# Patient Record
Sex: Male | Born: 1935 | Race: White | Hispanic: No | Marital: Married | Smoking: Former smoker
Health system: Southern US, Community
[De-identification: ages and names within clinical notes are randomized; demographics above are authoritative.]

## PROBLEM LIST (undated history)

## (undated) DIAGNOSIS — M47819 Spondylosis without myelopathy or radiculopathy, site unspecified: Secondary | ICD-10-CM

## (undated) DIAGNOSIS — Z8249 Family history of ischemic heart disease and other diseases of the circulatory system: Secondary | ICD-10-CM

## (undated) DIAGNOSIS — I4891 Unspecified atrial fibrillation: Secondary | ICD-10-CM

## (undated) DIAGNOSIS — G20 Parkinson's disease: Secondary | ICD-10-CM

## (undated) DIAGNOSIS — I2510 Atherosclerotic heart disease of native coronary artery without angina pectoris: Secondary | ICD-10-CM

## (undated) DIAGNOSIS — I10 Essential (primary) hypertension: Secondary | ICD-10-CM

## (undated) DIAGNOSIS — N200 Calculus of kidney: Secondary | ICD-10-CM

## (undated) DIAGNOSIS — E785 Hyperlipidemia, unspecified: Secondary | ICD-10-CM

## (undated) DIAGNOSIS — G20A1 Parkinson's disease without dyskinesia, without mention of fluctuations: Secondary | ICD-10-CM

## (undated) HISTORY — DX: Unspecified atrial fibrillation: I48.91

## (undated) HISTORY — PX: HERNIA REPAIR: SHX51

## (undated) HISTORY — DX: Parkinson's disease: G20

## (undated) HISTORY — DX: Parkinson's disease without dyskinesia, without mention of fluctuations: G20.A1

## (undated) HISTORY — DX: Spondylosis without myelopathy or radiculopathy, site unspecified: M47.819

## (undated) HISTORY — PX: KNEE SURGERY: SHX244

## (undated) HISTORY — DX: Hyperlipidemia, unspecified: E78.5

## (undated) HISTORY — DX: Essential (primary) hypertension: I10

## (undated) HISTORY — DX: Family history of ischemic heart disease and other diseases of the circulatory system: Z82.49

## (undated) HISTORY — DX: Calculus of kidney: N20.0

## (undated) HISTORY — DX: Atherosclerotic heart disease of native coronary artery without angina pectoris: I25.10

---

## 2008-08-20 ENCOUNTER — Emergency Department (HOSPITAL_BASED_OUTPATIENT_CLINIC_OR_DEPARTMENT_OTHER): Admission: EM | Admit: 2008-08-20 | Discharge: 2008-08-20 | Payer: Self-pay | Admitting: Emergency Medicine

## 2008-10-13 DIAGNOSIS — I2510 Atherosclerotic heart disease of native coronary artery without angina pectoris: Secondary | ICD-10-CM

## 2008-10-13 HISTORY — DX: Atherosclerotic heart disease of native coronary artery without angina pectoris: I25.10

## 2008-11-13 HISTORY — PX: CORONARY ARTERY BYPASS GRAFT: SHX141

## 2008-12-02 ENCOUNTER — Encounter: Payer: Self-pay | Admitting: Emergency Medicine

## 2008-12-02 ENCOUNTER — Ambulatory Visit: Payer: Self-pay | Admitting: Diagnostic Radiology

## 2008-12-02 ENCOUNTER — Ambulatory Visit: Payer: Self-pay | Admitting: Cardiology

## 2008-12-02 ENCOUNTER — Ambulatory Visit: Payer: Self-pay | Admitting: Surgery

## 2008-12-02 ENCOUNTER — Inpatient Hospital Stay (HOSPITAL_COMMUNITY): Admission: EM | Admit: 2008-12-02 | Discharge: 2008-12-13 | Payer: Self-pay | Admitting: Cardiology

## 2008-12-04 ENCOUNTER — Encounter (INDEPENDENT_AMBULATORY_CARE_PROVIDER_SITE_OTHER): Payer: Self-pay | Admitting: *Deleted

## 2008-12-05 ENCOUNTER — Encounter (INDEPENDENT_AMBULATORY_CARE_PROVIDER_SITE_OTHER): Payer: Self-pay | Admitting: *Deleted

## 2009-01-04 ENCOUNTER — Encounter: Admission: RE | Admit: 2009-01-04 | Discharge: 2009-01-04 | Payer: Self-pay | Admitting: Surgery

## 2009-01-04 ENCOUNTER — Encounter (HOSPITAL_COMMUNITY): Admission: RE | Admit: 2009-01-04 | Discharge: 2009-04-04 | Payer: Self-pay | Admitting: *Deleted

## 2009-01-04 ENCOUNTER — Ambulatory Visit: Payer: Self-pay | Admitting: Surgery

## 2010-06-25 ENCOUNTER — Ambulatory Visit: Payer: Self-pay | Admitting: Cardiology

## 2010-12-30 ENCOUNTER — Ambulatory Visit (INDEPENDENT_AMBULATORY_CARE_PROVIDER_SITE_OTHER): Payer: Medicare Other | Admitting: Cardiology

## 2010-12-30 DIAGNOSIS — Z951 Presence of aortocoronary bypass graft: Secondary | ICD-10-CM

## 2010-12-30 DIAGNOSIS — E785 Hyperlipidemia, unspecified: Secondary | ICD-10-CM

## 2010-12-30 DIAGNOSIS — I2510 Atherosclerotic heart disease of native coronary artery without angina pectoris: Secondary | ICD-10-CM

## 2010-12-30 DIAGNOSIS — I10 Essential (primary) hypertension: Secondary | ICD-10-CM

## 2011-01-23 LAB — CBC
Hemoglobin: 9.3 g/dL — ABNORMAL LOW (ref 13.0–17.0)
RBC: 2.85 MIL/uL — ABNORMAL LOW (ref 4.22–5.81)
RDW: 13.2 % (ref 11.5–15.5)
WBC: 7.4 10*3/uL (ref 4.0–10.5)

## 2011-01-23 LAB — BASIC METABOLIC PANEL
Calcium: 8.6 mg/dL (ref 8.4–10.5)
GFR calc Af Amer: >60 mL/min (ref >60)
GFR calc non Af Amer: >60 mL/min (ref >60)
Sodium: 138 mEq/L (ref 135–145)

## 2011-01-23 LAB — GLUCOSE, CAPILLARY
Glucose-Capillary: 100 mg/dL — ABNORMAL HIGH (ref 70–99)
Glucose-Capillary: 111 mg/dL — ABNORMAL HIGH (ref 70–99)
Glucose-Capillary: 118 mg/dL — ABNORMAL HIGH (ref 70–99)
Glucose-Capillary: 93 mg/dL (ref 70–99)

## 2011-01-23 LAB — PROTIME-INR
INR: 1.1 (ref 0.00–1.49)
Prothrombin Time: 19.1 seconds — ABNORMAL HIGH (ref 11.6–15.2)

## 2011-01-28 LAB — CBC
HCT: 45.6 % (ref 39.0–52.0)
HCT: 28.3 % — ABNORMAL LOW (ref 39.0–52.0)
HCT: 28.6 % — ABNORMAL LOW (ref 39.0–52.0)
HCT: 30.3 % — ABNORMAL LOW (ref 39.0–52.0)
HCT: 38.5 % — ABNORMAL LOW (ref 39.0–52.0)
HCT: 38.7 % — ABNORMAL LOW (ref 39.0–52.0)
HCT: 39.5 % (ref 39.0–52.0)
HCT: 41 % (ref 39.0–52.0)
Hemoglobin: 15.3 g/dL (ref 13.0–17.0)
Hemoglobin: 10.1 g/dL — ABNORMAL LOW (ref 13.0–17.0)
Hemoglobin: 10.5 g/dL — ABNORMAL LOW (ref 13.0–17.0)
Hemoglobin: 13.8 g/dL (ref 13.0–17.0)
Hemoglobin: 9.9 g/dL — ABNORMAL LOW (ref 13.0–17.0)
MCHC: 34.4 g/dL (ref 30.0–36.0)
MCHC: 34.5 g/dL (ref 30.0–36.0)
MCHC: 35 g/dL (ref 30.0–36.0)
MCHC: 35.1 g/dL (ref 30.0–36.0)
MCV: 93.3 fL (ref 78.0–100.0)
MCV: 94.1 fL (ref 78.0–100.0)
MCV: 94.1 fL (ref 78.0–100.0)
Platelets: 122 10*3/uL — ABNORMAL LOW (ref 150–400)
Platelets: 122 10*3/uL — ABNORMAL LOW (ref 150–400)
Platelets: 133 10*3/uL — ABNORMAL LOW (ref 150–400)
Platelets: 136 10*3/uL — ABNORMAL LOW (ref 150–400)
Platelets: 138 10*3/uL — ABNORMAL LOW (ref 150–400)
RBC: 2.97 MIL/uL — ABNORMAL LOW (ref 4.22–5.81)
RBC: 3.04 MIL/uL — ABNORMAL LOW (ref 4.22–5.81)
RBC: 3.24 MIL/uL — ABNORMAL LOW (ref 4.22–5.81)
RBC: 4.15 MIL/uL — ABNORMAL LOW (ref 4.22–5.81)
RBC: 4.2 MIL/uL — ABNORMAL LOW (ref 4.22–5.81)
RDW: 11.9 % (ref 11.5–15.5)
RDW: 12.2 % (ref 11.5–15.5)
RDW: 12.4 % (ref 11.5–15.5)
RDW: 12.4 % (ref 11.5–15.5)
RDW: 13.2 % (ref 11.5–15.5)
WBC: 5.7 10*3/uL (ref 4.0–10.5)
WBC: 11.1 10*3/uL — ABNORMAL HIGH (ref 4.0–10.5)
WBC: 12 10*3/uL — ABNORMAL HIGH (ref 4.0–10.5)
WBC: 13 10*3/uL — ABNORMAL HIGH (ref 4.0–10.5)
WBC: 6.3 10*3/uL (ref 4.0–10.5)
WBC: 7 10*3/uL (ref 4.0–10.5)

## 2011-01-28 LAB — POCT I-STAT 4, (NA,K, GLUC, HGB,HCT)
Glucose, Bld: 102 mg/dL — ABNORMAL HIGH (ref 70–99)
Glucose, Bld: 104 mg/dL — ABNORMAL HIGH (ref 70–99)
Glucose, Bld: 113 mg/dL — ABNORMAL HIGH (ref 70–99)
Glucose, Bld: 116 mg/dL — ABNORMAL HIGH (ref 70–99)
Glucose, Bld: 128 mg/dL — ABNORMAL HIGH (ref 70–99)
Glucose, Bld: 97 mg/dL (ref 70–99)
HCT: 24 % — ABNORMAL LOW (ref 39.0–52.0)
HCT: 25 % — ABNORMAL LOW (ref 39.0–52.0)
HCT: 25 % — ABNORMAL LOW (ref 39.0–52.0)
HCT: 26 % — ABNORMAL LOW (ref 39.0–52.0)
HCT: 29 % — ABNORMAL LOW (ref 39.0–52.0)
HCT: 38 % — ABNORMAL LOW (ref 39.0–52.0)
Hemoglobin: 12.9 g/dL — ABNORMAL LOW (ref 13.0–17.0)
Hemoglobin: 8.2 g/dL — ABNORMAL LOW (ref 13.0–17.0)
Hemoglobin: 8.5 g/dL — ABNORMAL LOW (ref 13.0–17.0)
Hemoglobin: 8.5 g/dL — ABNORMAL LOW (ref 13.0–17.0)
Hemoglobin: 8.5 g/dL — ABNORMAL LOW (ref 13.0–17.0)
Hemoglobin: 8.8 g/dL — ABNORMAL LOW (ref 13.0–17.0)
Hemoglobin: 8.8 g/dL — ABNORMAL LOW (ref 13.0–17.0)
Hemoglobin: 9.9 g/dL — ABNORMAL LOW (ref 13.0–17.0)
Potassium: 3.6 mEq/L (ref 3.5–5.1)
Potassium: 4 mEq/L (ref 3.5–5.1)
Potassium: 4.6 mEq/L (ref 3.5–5.1)
Sodium: 131 mEq/L — ABNORMAL LOW (ref 135–145)
Sodium: 134 mEq/L — ABNORMAL LOW (ref 135–145)
Sodium: 136 mEq/L (ref 135–145)
Sodium: 139 mEq/L (ref 135–145)

## 2011-01-28 LAB — POCT I-STAT 3, ART BLOOD GAS (G3+)
Acid-Base Excess: 3 mmol/L — ABNORMAL HIGH (ref 0.0–2.0)
Acid-base deficit: 2 mmol/L (ref 0.0–2.0)
Bicarbonate: 22.8 mEq/L (ref 20.0–24.0)
Bicarbonate: 25.4 mEq/L — ABNORMAL HIGH (ref 20.0–24.0)
Bicarbonate: 28.9 mEq/L — ABNORMAL HIGH (ref 20.0–24.0)
Bicarbonate: 29.1 mEq/L — ABNORMAL HIGH (ref 20.0–24.0)
O2 Saturation: 100 %
O2 Saturation: 100 %
O2 Saturation: 98 %
Patient temperature: 37
TCO2: 24 mmol/L (ref 0–100)
TCO2: 31 mmol/L (ref 0–100)
pCO2 arterial: 41.9 mmHg (ref 35.0–45.0)
pCO2 arterial: 43.7 mmHg (ref 35.0–45.0)
pCO2 arterial: 50.4 mmHg — ABNORMAL HIGH (ref 35.0–45.0)
pCO2 arterial: 55.3 mmHg — ABNORMAL HIGH (ref 35.0–45.0)
pH, Arterial: 7.33 — ABNORMAL LOW (ref 7.350–7.450)
pH, Arterial: 7.364 (ref 7.350–7.450)
pH, Arterial: 7.366 (ref 7.350–7.450)
pO2, Arterial: 150 mmHg — ABNORMAL HIGH (ref 80.0–100.0)
pO2, Arterial: 445 mmHg — ABNORMAL HIGH (ref 80.0–100.0)

## 2011-01-28 LAB — TYPE AND SCREEN: Antibody Screen: NEGATIVE

## 2011-01-28 LAB — POCT I-STAT, CHEM 8
BUN: 12 mg/dL (ref 6–23)
Creatinine, Ser: 0.8 mg/dL (ref 0.4–1.5)
HCT: 29 % — ABNORMAL LOW (ref 39.0–52.0)
Hemoglobin: 9.9 g/dL — ABNORMAL LOW (ref 13.0–17.0)
Potassium: 3.6 mEq/L (ref 3.5–5.1)
Sodium: 138 mEq/L (ref 135–145)
Sodium: 138 mEq/L (ref 135–145)
TCO2: 23 mmol/L (ref 0–100)

## 2011-01-28 LAB — URINALYSIS, ROUTINE W REFLEX MICROSCOPIC
Bilirubin Urine: NEGATIVE
Ketones, ur: NEGATIVE mg/dL
Nitrite: NEGATIVE
Protein, ur: NEGATIVE mg/dL
Specific Gravity, Urine: 1.008 (ref 1.005–1.030)
Urobilinogen, UA: 1 mg/dL (ref 0.0–1.0)

## 2011-01-28 LAB — GLUCOSE, CAPILLARY
Glucose-Capillary: 104 mg/dL — ABNORMAL HIGH (ref 70–99)
Glucose-Capillary: 105 mg/dL — ABNORMAL HIGH (ref 70–99)
Glucose-Capillary: 117 mg/dL — ABNORMAL HIGH (ref 70–99)
Glucose-Capillary: 117 mg/dL — ABNORMAL HIGH (ref 70–99)
Glucose-Capillary: 119 mg/dL — ABNORMAL HIGH (ref 70–99)
Glucose-Capillary: 120 mg/dL — ABNORMAL HIGH (ref 70–99)
Glucose-Capillary: 131 mg/dL — ABNORMAL HIGH (ref 70–99)
Glucose-Capillary: 143 mg/dL — ABNORMAL HIGH (ref 70–99)
Glucose-Capillary: 150 mg/dL — ABNORMAL HIGH (ref 70–99)
Glucose-Capillary: 173 mg/dL — ABNORMAL HIGH (ref 70–99)
Glucose-Capillary: 186 mg/dL — ABNORMAL HIGH (ref 70–99)

## 2011-01-28 LAB — LIPID PANEL
Cholesterol: 128 mg/dL (ref 0–200)
LDL Cholesterol: 86 mg/dL (ref 0–99)
Total CHOL/HDL Ratio: 4.6 RATIO
Triglycerides: 72 mg/dL (ref <150)
VLDL: 14 mg/dL (ref 0–40)
VLDL: 23 mg/dL (ref 0–40)

## 2011-01-28 LAB — CREATININE, SERUM
GFR calc Af Amer: >60 mL/min (ref >60)
GFR calc non Af Amer: >60 mL/min (ref >60)

## 2011-01-28 LAB — BASIC METABOLIC PANEL
BUN: 13 mg/dL (ref 6–23)
BUN: 15 mg/dL (ref 6–23)
BUN: 17 mg/dL (ref 6–23)
BUN: 18 mg/dL (ref 6–23)
CO2: 27 mEq/L (ref 19–32)
CO2: 28 mEq/L (ref 19–32)
Calcium: 8 mg/dL — ABNORMAL LOW (ref 8.4–10.5)
Calcium: 8.3 mg/dL — ABNORMAL LOW (ref 8.4–10.5)
Calcium: 9.2 mg/dL (ref 8.4–10.5)
Chloride: 104 mEq/L (ref 96–112)
Chloride: 105 mEq/L (ref 96–112)
Creatinine, Ser: 0.95 mg/dL (ref 0.4–1.5)
Creatinine, Ser: 0.97 mg/dL (ref 0.4–1.5)
GFR calc Af Amer: >60 mL/min (ref >60)
GFR calc Af Amer: >60 mL/min (ref >60)
GFR calc non Af Amer: >60 mL/min (ref >60)
GFR calc non Af Amer: >60 mL/min (ref >60)
GFR calc non Af Amer: >60 mL/min (ref >60)
Glucose, Bld: 101 mg/dL — ABNORMAL HIGH (ref 70–99)
Glucose, Bld: 109 mg/dL — ABNORMAL HIGH (ref 70–99)
Glucose, Bld: 136 mg/dL — ABNORMAL HIGH (ref 70–99)
Glucose, Bld: 98 mg/dL (ref 70–99)
Potassium: 3.5 mEq/L (ref 3.5–5.1)
Potassium: 3.6 mEq/L (ref 3.5–5.1)
Potassium: 3.6 mEq/L (ref 3.5–5.1)
Potassium: 3.8 mEq/L (ref 3.5–5.1)
Sodium: 136 mEq/L (ref 135–145)
Sodium: 138 mEq/L (ref 135–145)
Sodium: 140 mEq/L (ref 135–145)

## 2011-01-28 LAB — BLOOD GAS, ARTERIAL
Acid-Base Excess: 3.1 mmol/L — ABNORMAL HIGH (ref 0.0–2.0)
Bicarbonate: 27.1 mEq/L — ABNORMAL HIGH (ref 20.0–24.0)
Patient temperature: 98
TCO2: 28.4 mmol/L (ref 0–100)
pCO2 arterial: 40.5 mmHg (ref 35.0–45.0)

## 2011-01-28 LAB — DIFFERENTIAL
Basophils Absolute: 0.1 10*3/uL (ref 0.0–0.1)
Basophils Relative: 1 % (ref 0–1)
Neutro Abs: 3.8 10*3/uL (ref 1.7–7.7)
Neutrophils Relative %: 69 % (ref 43–77)

## 2011-01-28 LAB — HEPARIN LEVEL (UNFRACTIONATED): Heparin Unfractionated: 0.18 IU/mL — ABNORMAL LOW (ref 0.30–0.70)

## 2011-01-28 LAB — POCT I-STAT 3, VENOUS BLOOD GAS (G3P V)
Acid-Base Excess: 1 mmol/L (ref 0.0–2.0)
Bicarbonate: 27 mEq/L — ABNORMAL HIGH (ref 20.0–24.0)
O2 Saturation: 93 %
TCO2: 29 mmol/L (ref 0–100)
pCO2, Ven: 49.9 mmHg (ref 45.0–50.0)
pO2, Ven: 72 mmHg — ABNORMAL HIGH (ref 30.0–45.0)

## 2011-01-28 LAB — COMPREHENSIVE METABOLIC PANEL
Alkaline Phosphatase: 89 U/L (ref 39–117)
BUN: 17 mg/dL (ref 6–23)
Chloride: 104 mEq/L (ref 96–112)
GFR calc non Af Amer: >60 mL/min (ref >60)
Glucose, Bld: 137 mg/dL — ABNORMAL HIGH (ref 70–99)
Potassium: 3.7 mEq/L (ref 3.5–5.1)
Total Bilirubin: 0.9 mg/dL (ref 0.3–1.2)

## 2011-01-28 LAB — POCT CARDIAC MARKERS
CKMB, poc: 1.8 ng/mL (ref 1.0–8.0)
Myoglobin, poc: 95.3 ng/mL (ref 12–200)

## 2011-01-28 LAB — CK TOTAL AND CKMB (NOT AT ARMC)
CK, MB: 2.6 ng/mL (ref 0.3–4.0)
CK, MB: 3.9 ng/mL (ref 0.3–4.0)
CK, MB: 4 ng/mL (ref 0.3–4.0)
Total CK: 67 U/L (ref 7–232)
Total CK: 85 U/L (ref 7–232)

## 2011-01-28 LAB — MAGNESIUM: Magnesium: 2.2 mg/dL (ref 1.5–2.5)

## 2011-01-28 LAB — PROTIME-INR: INR: 1.5 (ref 0.00–1.49)

## 2011-01-28 LAB — TROPONIN I: Troponin I: 0.33 ng/mL — ABNORMAL HIGH (ref 0.00–0.06)

## 2011-01-28 LAB — PLATELET COUNT: Platelets: 89 10*3/uL — ABNORMAL LOW (ref 150–400)

## 2011-01-28 LAB — APTT: aPTT: 40 seconds — ABNORMAL HIGH (ref 24–37)

## 2011-02-25 NOTE — Cardiovascular Report (Signed)
NAME:  Harry Morgan, Harry Morgan NO.:  [id number]   MEDICAL RECORD NO.:  [id number]          PATIENT TYPE:  INP   LOCATION:  2923                         FACILITY:  [facility]   PHYSICIAN:  Elmore Guise., M.D.DATE OF BIRTH:  [birth year]   DATE OF PROCEDURE:  12/04/2008  DATE OF DISCHARGE:                            CARDIAC CATHETERIZATION   INDICATIONS FOR PROCEDURE:  Unstable angina.   HISTORY OF PRESENT ILLNESS:  [name] is a very pleasant 77 year old  white male who presented for evaluation of increasing exertional chest  pain.  He is now referred for cardiac catheterization.   DESCRIPTION OF PROCEDURE:  The patient was brought to the cardiac cath  lab.  After appropriate informed consent, he was prepped and draped in a  sterile fashion.  Approximately 5 mL of 1% lidocaine was used for local  anesthesia.  A 5-French sheath was placed in the right femoral artery  without difficulty.  Coronary angiography, LV angiography were then  performed.  The patient was transferred from the cardiac cath lab in  stable condition.   FINDINGS:  1. Left main:  Mild calcification with mild luminal irregularities.  2. LAD:  Mild proximal/mid calcification with mid 50-60% stenosis      after bifurcation with first diagonal.  3. D1:  Ostial 50-60% stenosis.  4. Circumflex:  Nondominant with proximal 60-70% stenosis with plaque      extending to the ostium and proximal part of the first OM vessel.      First OM has an 80-90% ostial/proximal stenosis.  5. OM2:  Large vessel with mild luminal irregularities.  6. RCA:  Dominant with proximal 95% stenosis with sluggish flow down      the mid and distal vessel.  Moderate mid and distal RCA disease was      noted.  7. LV:  EF is 55-60%.  No wall motion abnormalities.  LVEDP is 14      mmHg.   IMPRESSION:  1. Multivessel obstructive coronary artery disease.  2. Preserved left ventricular systolic function, ejection fraction of  55-60%.   PLAN:  At this time, I would recommend continued aggressive medical  therapy.  We will continue low dose heparin because of sluggish flow in  his right coronary artery.  We will obtain a CT surgery consult for  surgical revascularization.      Elmore Guise., M.D.  Electronically Signed     TWK/MEDQ  D:  12/04/2008  T:  12/05/2008  Job:  [id number]

## 2011-02-25 NOTE — Consult Note (Signed)
NAME:  Harry Morgan, DIMOND NO.:  [id number]   MEDICAL RECORD NO.:  [id number]          PATIENT TYPE:  INP   LOCATION:  2923                         FACILITY:  [facility]   PHYSICIAN:  Evelene Croon, M.D.     DATE OF BIRTH:  [birth year]   DATE OF CONSULTATION:  12/05/2008  DATE OF DISCHARGE:                                 CONSULTATION   REFERRING PHYSICIAN:  Rosine Abe, MD   REASON FOR CONSULTATION:  Severe three-vessel coronary disease with  unstable angina.   CLINICAL HISTORY:  I was asked by Dr. Reyes Ivan to evaluate Mr. Winterhalter  for consideration of coronary artery bypass graft surgery.  He is 77-  year-old gentleman with no prior cardiac history who reports recent  history of substernal chest pain developing while walking up and  inclined to his mailbox.  This goes away after a few minutes of rest.  He reported this to be about 7/10 in intensity and different from  previous reflux symptoms that he has had.  His wife said that over the  past couple of weeks he has not been able to do his daily chores around  the house.  He presented to [facility] Room for  electrocardiogram reportedly showed no changes.  His point-of-care  markers were negative x1.  He was transferred to [facility].  His  troponin here initially was 0.33 decreasing to 0.22.  He did have some  brief episodes of chest pain since presentation to [facility].  He  underwent cardiac catheterization yesterday afternoon, which showed  severe three-vessel coronary disease.  The LAD had mild proximal and mid  calcification with 50-60% stenosis.  There is an diagonal branch with 50-  60% ostial stenosis.  The left circumflex was nondominant and proximal  60-70% stenosis.  The first marginal was a small-to-moderate sized  vessel that had 80-90% ostial and proximal stenosis.  The second  marginal was a large vessel.  The right coronary artery was dominant and  had a proximal 95% plus stenosis  and was sluggish flow down the vessel  distally.  Ejection fraction was 55-60% with no mitral regurgitation.  Since catheterization, the patient has improved some episodes of chest  pain with ambulation to the bathroom.   REVIEW OF SYSTEMS:  GENERAL:  He denies any fever or chills.  He has had  no recent weight changes.  He has had some fatigue.  EYES:  Negative.  ENT:  He is hard of hearing and wears bilateral hearing aids.  ENDOCRINE:  He denies diabetes or hypothyroidism.  CARDIOVASCULAR:  As  above.  He denies PND or orthopnea.  He has had no exertional dyspnea.  He has had no peripheral edema or palpitations.  RESPIRATORY:  He denies  cough and sputum production.  GI:  He has had no nausea or vomiting.  He  denies melena and bright red blood per rectum.  GU:  He denies dysuria  or hematuria.  He does have urinary frequency with small volumes,  worsening and present for several years.  NEUROLOGICAL:  He denies any  focal  weakness or numbness.  He does have diagnosis of Parkinson disease  with mild tremor in his right arm.  He has never had TIA or stroke.  SKIN:  Negative.  HEMATOLOGICAL:  Negative.  ALLERGIES:  None.  MUSCULOSKELETAL:  He does have history of arthritis and he has undergone  arthroscopy on his right knee.   ALLERGIES:  None.   PAST MEDICAL HISTORY:  Significant for hypertension times many years.  He is recently diagnosed of Parkinson disease treated medically.  He has  history of degenerative joint disease.  He has undergone bilateral knee  arthroscopy in the past.  He has a history of kidney stones.  He has  history of inguinal hernia repair x2 on the right and 1 on the left.   MEDICATIONS PRIOR TO ADMISSION:  1. Lopressor 100 mg b.i.d.  2. Benicar 20 mg at bedtime.  3. HCTZ 25 mg daily.  4. Carbidopa/levodopa 25/100 t.i.d.   SOCIAL HISTORY:  He is married x50 years and lives with his wife.  He  has one adult child.  He is retired and Artist as a  hobby.  He  quit smoking about 35 years ago after smoking for 20 years.   FAMILY HISTORY:  Negative for coronary artery disease.  His mother died  of aneurysm in the young age.  Father lived into his 12s and died of  unknown reason.   PHYSICAL EXAMINATION:  VITAL SIGNS:  His blood pressure is 123/58, pulse  is 85 and regular, respiratory rate is 16 nonlabored.  GENERAL:  He is a well-developed elderly white male who looks younger  than the stated age.  HEENT:  Normocephalic and atraumatic.  Pupils are equal and reactive to  light and accommodation.  Extraocular muscles are intact.  Throat is  clear.  NECK:  Normal carotid pulses bilaterally.  There are no bruits.  There  is no adenopathy or thyromegaly.  CARDIAC:  Regular rate and rhythm with normal S1 and S2.  There is no  murmur, rub, or gallop.  LUNGS:  Clear.  ABDOMEN:  Active bowel sounds.  Abdomen is soft and nontender.  There  are no palpable masses or organomegaly.  EXTREMITIES:  No peripheral edema.  Pedal pulses are palpable  bilaterally.  SKIN:  Warm and dry.  NEUROLOGIC:  Alert and oriented x3.  Motor and sensory exam are grossly  normal.   IMAGING:  Electrocardiogram shows normal sinus rhythm with inferior ST  abnormality.   LABORATORY DATA:  LFTs were normal on presentation.  BUN was normal and  his creatinine was 1.0.  Electrolytes were normal.  His CPK was 85 with  MB of 3.9, troponin I was 0.23.   IMPRESSION:  Mr. Maul had severe three-vessel coronary artery  disease presenting with unstable angina with positive troponin.  I  agreed that coronary artery bypass graft surgery is the best treatment  to prevent further ischemia and infarction.  I discussed the operative  procedure with the patient and his wife including alternative, benefits,  and risks including, but not limited to bleeding, blood transfusion,  infection, stroke, myocardial infarction, graft failure, and death.  They understand and agreed to  proceed.  We will plan to do this tomorrow  morning.      Evelene Croon, M.D.  Electronically Signed     BB/MEDQ  D:  12/05/2008  T:  12/05/2008  Job:  [id number]   cc:   Elmore Guise., M.D.

## 2011-02-25 NOTE — Discharge Summary (Signed)
NAME:  KENTRAVIOUS, LIPFORD NO.:  [id number]   MEDICAL RECORD NO.:  [id number]          PATIENT TYPE:  INP   LOCATION:  2015                         FACILITY:  [facility]   PHYSICIAN:  Evelene Croon, M.D.     DATE OF BIRTH:  [birth year]   DATE OF ADMISSION:  12/02/2008  DATE OF DISCHARGE:  12/13/2008                               DISCHARGE SUMMARY   ADDENDUM:   DISCHARGE MEDICATIONS:  Coumadin 2.5 mg p.o. daily or as directed.   FOLLOWUP:  A third followup appointment has been added.  The patient  needs to have his PT and INR drawn on Friday, December 16, 2003.  The  patient is to contact [facility] for an appointment time for  this PT and INR to be drawn.  I have discussed with the wife that home  health has been set up and that she may also have the option of having  home health to draw the PT and INR on Friday and fax the results to  [facility], but that she would have to call [facility]  Cardiology's Office first to make sure that this was okay with them.      Doree Fudge, [place]      Evelene Croon, M.D.  Electronically Signed    DZ/MEDQ  D:  12/13/2008  T:  12/14/2008  Job:  [id number]

## 2011-02-25 NOTE — Op Note (Signed)
NAME:  Harry Morgan, Harry Morgan NO.:  [id number]   MEDICAL RECORD NO.:  [id number]          PATIENT TYPE:  INP   LOCATION:  2303                         FACILITY:  [facility]   PHYSICIAN:  Evelene Croon, M.D.     DATE OF BIRTH:  06-12-1928   DATE OF PROCEDURE:  12/06/2008  DATE OF DISCHARGE:                               OPERATIVE REPORT   PREOPERATIVE DIAGNOSIS:  Severe three-vessel coronary disease with  unstable angina.   POSTOPERATIVE DIAGNOSIS:  Severe three-vessel coronary disease with  unstable angina.   OPERATIVE PROCEDURES:  Median sternotomy, extracorporeal circulation,  coronary artery bypass graft surgery x5 using a left internal mammary  artery graft to left anterior descending coronary artery, with a  saphenous vein graft to diagonal branch of the left anterior descending,  a sequential saphenous vein graft to first and second obtuse marginal  branches of the left circumflex coronary artery, and a saphenous vein  graft to the distal right coronary.  Endoscopic vein harvesting from the  right leg.   ATTENDING SURGEON:  Evelene Croon, MD   ASSISTANT:  Kerin Perna, MD   SECOND ASSISTANT:  Stephanie Acre. Dasovich, PAC   ANESTHESIA:  General endotracheal.   CLINICAL HISTORY:  This patient is a 77 year old gentleman with no prior  history of cardiac disease who was admitted with a recent onset of  exertional anginal symptoms occurring while walking up with a slight  incline.  The symptoms progressed to the point where he had some chest  pain at rest.  He presented to the [facility] Emergency Room  where he ruled out for myocardial infarction and was transferred to  [facility] for further care.  His initial troponin here was 0.33 and  decreased to 0.22.  He underwent cardiac catheterization by Dr. Reyes Ivan  which showed severe three-vessel disease with the culprit appearing to  be a 95-99% proximal right coronary stenosis with slow flow distally.  Ejection fraction of 55-60% with no mitral regurgitation.  After review  of the catheterization and examination of the patient, I felt that a  coronary artery bypass graft surgery was the best treatment to prevent  further ischemia and infarction and relief of symptoms.  I discussed the  operative procedure with the patient and his wife including  alternatives, benefits, and risks including but not limited to bleeding,  blood transfusion, infection, stroke, myocardial infarction, graft  failure, and death.  They understood and agreed to proceed.   OPERATIVE PROCEDURE:  The patient was taken to operating room and placed  on the table in supine position.  After induction of general  endotracheal anesthesia, a Foley catheter was placed in bladder using  sterile technique.  Then the chest, abdomen, and both lower extremities  were prepped and draped in the usual sterile manner.  The chest was  entered through a median sternotomy incision and the pericardium opened  midline.  Examination of the heart showed good ventricular  contractility.  The ascending aorta was of normal size and had no  palpable plaques in it.   Then the left internal mammary artery was  harvested from the chest wall  as a pedicle graft.  This was a medium caliber vessel with excellent  blood flow through it.  At the same time, a segment of greater saphenous  vein was harvested from the right leg using endoscopic vein harvest  technique.  This vein was of medium size and fair quality with slightly  thickened walls in some places.   Then the patient was heparinized.  When an adequate activated clotting  time was achieved, the distal ascending aorta was cannulated using a 20-  [place] aortic cannula for arterial inflow.  Venous outflow was achieved  using a 2-stage venous cannula through the right atrial appendage.  An  antegrade cardioplegia and vent cannula was inserted into the aortic  root.   The patient was placed on  cardiopulmonary bypass and distal coronaries  were identified.  The LAD was lying deep in the interventricular groove  and was located distally where it was a large graftable vessel with mild  distal disease in it.  The diagonal branch was also deep in the  epicardial flap and was located and was suitable for grafting.  The left  circumflex gave off a moderate-sized first marginal branch which was  intramyocardial but located without difficulty.  It then gave off a  large second marginal branch that was epicardial and had no distal  disease in it.  The right coronary artery was diffusely diseased.  The  posterior descending and posterolateral branches were both small and  lying deep to large epicardial vein.  Therefore, it was necessary to  graft the distal right coronary artery just before the takeoff of the  posterior descending branch.  There was still moderate disease in the  right coronary artery in this location but no significant stenosis.   Then the aorta was cross-clamped and 600 mL of cold blood antegrade  cardioplegia was administered in the aortic root with quick arrest of  the heart.  Systemic hypothermia to 20 degrees centigrade and topical  hypothermia with iced saline was used.  A temperature probe was placed  in the septum and insulating pad in the pericardium.   The first distal anastomosis was then performed to the first marginal  branch.  The internal diameter of this vessel was about 1.6 mm.  The  conduit used was a segment of greater saphenous vein.  The anastomosis  was performed in a sequential side-to-side manner using continuous 7-0  Prolene suture.  Flow was noted through the graft and was excellent.   Second distal anastomosis was performed to the second marginal branch.  The internal diameter of this vessel was about 2 mm.  The conduit used  was the same segment of greater saphenous vein.  The anastomosis was  performed in a sequential end-to-side manner  using continuous 7-0  Prolene suture.  Flow was noted through the graft and was excellent.   The third distal anastomosis was performed to the distal right coronary  artery.  The internal diameter of this vessel was about 2.5 mm.  The  conduit used was a second segment of greater saphenous vein.  The  anastomosis was performed in a end-to-side manner using continuous 7-0  Prolene suture.  Flow was noted through the graft and was excellent.  Then another dose of cardioplegia was given down the vein grafts and in  the aortic root.   The fourth distal anastomosis was then performed to the diagonal branch.  The internal diameter was 1.5 mm.  The conduit used was a third segment  of greater saphenous vein and the anastomosis was performed in a end-to-  side manner using continuous 7-0 Prolene suture.  Flow was noted through  the graft and was excellent.   The fifth distal anastomosis was performed to the distal LAD.  The  internal diameter was 2 mm.  The conduit used was a left internal  mammary graft and this was brought through an opening in the left  pericardium, anterior to the phrenic nerve.  This was anastomosed to the  LAD in an end-to-side manner using continuous 8-0 Prolene suture.  The  pedicle was sutured to the epicardium with 6-0 Prolene sutures.  The  patient was then rewarmed to 37 degrees centigrade.  Another dose of  cardioplegia was given.  With the cross-clamp in place, the 3 proximal  vein graft anastomoses were performed in the aortic root in an end-to-  side manner using continuous 6-0 Prolene suture.  Then the clamp was  removed from the mammary pedicle.  There was rapid warming of the  ventricular septum and return of spontaneous ventricular fibrillation.  The cross-clamp was removed with time of 86 minutes.  The patient  defibrillated into sinus rhythm.  The proximal and distal anastomoses  appeared hemostatic and while the grafts looked satisfactory.  Graft   markers were placed around the proximal anastomoses.  Two temporary  right ventricular and right atrial pacing wires were placed and brought  out through the skin.   Then the patient was rewarmed to 37 degrees centigrade.  He was weaned  from cardiopulmonary bypass on no inotropic agents.  Total bypass time  was 190 minutes.  Cardiac function appeared excellent with a cardiac  output of 6 L/min.  Protamine was given and the venous and the aortic  cannulas were removed without difficulty.  The patient was given 1 unit  of platelets since his platelet count was 85,000.  After obtaining  hemostasis, 3 chest tubes were placed with two in the posterior  pericardium, one in the anterior mediastinum, and one in the left  pleural space.  Sternum was then closed with double #6 stainless steel  wires.  The fascia was closed with continuous #1 Vicryl suture.  Subcutaneous tissues were closed with continuous 2-0 Vicryl and the skin  with a 3-0 Vicryl subcuticular closure.  Lower extremity vein harvest  site was closed in layers in similar manner.  The sponge, needle, and  instrument counts were correct according to the scrub nurse.  Dry  sterile dressings were applied over the incisions around the chest tubes  which were Pleur-Evac suction.  The patient remained hemodynamically  stable and was transferred to the SICU in guarded but stable condition.      Evelene Croon, M.D.  Electronically Signed     BB/MEDQ  D:  12/06/2008  T:  12/07/2008  Job:  [id number]   cc:   Elmore Guise., M.D.

## 2011-02-25 NOTE — H&P (Signed)
NAME:  Harry Morgan, Harry Morgan NO.:  [id number]   MEDICAL RECORD NO.:  [id number]          PATIENT TYPE:  INP   LOCATION:  3703                         FACILITY:  [facility]   PHYSICIAN:  Rollene Rotunda, MD, FACCDATE OF BIRTH:  May 05, 1928   DATE OF ADMISSION:  12/02/2008  DATE OF DISCHARGE:                              HISTORY & PHYSICAL   The primary care is Dr. Olivia Canter.  Cardiologist: None.   REASON FOR PRESENTATION:  Chest pain.   HISTORY OF PRESENT ILLNESS:  The patient is pleasant 77 year old  gentleman without prior cardiac history.  He has developed chest  discomfort over the last couple of days.  This happens when he is up and  walking.  It goes away after a few minutes of rest.  He points to his  lower sternum.  It can get to 07/10 in intensity.  It is not like  previous reflux or symptoms he has had in the past.  He describes it as  a sharp discomfort.  There is no radiation to his jaw or to his arms.  There is no associated nausea, vomiting or diaphoresis.  His wife said  he looked very uncomfortable and could not do his daily chores today  because of this.  He has had no resting symptoms.  He has had no  palpitations, presyncope or syncope.  He has had no PND or orthopnea.  He went to [facility] ER.  I do not have that EKG but apparently were no  acute changes.  It was treated with Lovenox, nitroglycerin paste, beta  blockers and aspirin.  He has been pain free.  His point of care markers  were negative x1.  He was transferred here.   PAST MEDICAL HISTORY:  Hypertension for many years, Parkinson's disease,  recently diagnosed, degenerative joint disease, nephrolithiasis.   PAST SURGICAL HISTORY:  Inguinal hernia repair x2 on the right and 1 on  the left, bilateral arthroscopic knee surgery.   ALLERGIES AND INTOLERANCES:  None.   MEDICATIONS:  Metoprolol 100 mg b.i.d., Benicar 20 mg q.h.s.,  hydrochlorothiazide 25 mg daily, carbidopa levodopa 25/100  t.i.d.   SOCIAL HISTORY:  The patient is married.  He has 1 child.  He is  retired.  He raises peasants. He quit smoking 35 years ago after smoking  for 20 years.   FAMILY HISTORY:  Noncontributory for early coronary artery disease.  Mother apparently died of an aneurysm at a young age.  His father lived  into his 7's.   REVIEW OF SYSTEMS:  As stated in the HPI positive for bilateral knee  pain and occasional dyspnea with exertion but negative for all other  systems.   PHYSICAL EXAMINATION:  GENERAL:  The patient is pleasant and in no  distress.  VITAL SIGNS:  Blood pressure 160/106, heart rate 85 and regular,  respiratory rate 16, afebrile.  HEENT: Eyelids are unremarkable.  Pupils are equal, round, and reactive  to light. Fundi are not visualized, oral mucosa unremarkable.  NECK: No jugular distention at 45 degrees. Carotid upstroke brisk and  symmetrical.  No bruits, no thyromegaly.  LYMPHATICS:  No cervical,  axillary, or inguinal adenopathy.  LUNGS:  Clear to auscultation bilaterally.  BACK:  No costovertebral angle tenderness.  CHEST:  Unremarkable.  HEART:  PMI not displaced or sustained, S1-S2 within normal limits. No  S3, no S4. No clicks, rubs, no murmurs.  ABDOMEN:  Flat, positive bowel sounds. Normal in frequency and pitch.  No bruits, no rebound, no guarding, no midline pulsatile mass, no  hepatomegaly, no splenomegaly.  SKIN:  No rashes.  No nodules.  EXTREMITIES:  With 2+ pulses throughout, no edema, no cyanosis, no  clubbing.  NEURO:  Oriented to person, place, and time. Cranial nerves II-XII are  grossly intact. Motor grossly intact. Slight right hand tremor.   LABS:  WBC 5.7, hemoglobin 15.3, platelets 166, sodium 143, potassium  2.7, BUN 17, creatinine 1, point care markers negative x2.   Chest x-ray: No acute disease.   EKG sinus rhythm, rate 82, axis within normal limits, intervals within  normal limits, nonspecific inferior ST-T changes.    ASSESSMENT/PLAN:  1. The patient's chest comfort is very suspicious for unstable angina.      I think the pretest probability of obstructive coronary disease is      high.  Therefore, cardiac catheterization will be indicated      electively.  He will be treated with Lovenox nitroglycerin paste,      aspirin and beta blocker.  If he has any urgent symptoms will take      him more urgently to catheterization.  2. Hypertension.  We will use medications as listed in addition to the      nitroglycerin paste.  3. Risk reduction.  Will check a lipid profile and treat      appropriately.  4. Parkinson's disease.  Will continue the carbidopa-levodopa.      Rollene Rotunda, MD, [facility]  Electronically Signed     JH/MEDQ  D:  12/02/2008  T:  12/03/2008  Job:  [id number]   cc:   Olivia Canter, MD

## 2011-02-25 NOTE — Assessment & Plan Note (Signed)
OFFICE VISIT   Harry Morgan, Harry Morgan  DOB:  [birth year]                                        January 04, 2009  CHART #:  [id number]   HISTORY:  The patient returned today for followup status post coronary  artery bypass graft surgery x5 on December 06, 2008.  He has been  recovering slowly due to his Parkinson disease but says that he feels  much better than he did at the time of discharge.  He has been walking  without chest pain or shortness of breath.  He said his energy level is  still not back to normal.  I reassured him this is to be expected.  He  was sent home on Coumadin and amiodarone due to atrial fibrillation and  said that his INR increased to about 5 on Coumadin 2.5 mg per day and  this was held for few days and decreased to 1.25 mg per day.   PHYSICAL EXAMINATION:  VITAL SIGNS:  Today his blood pressure is 143/82,  his pulse is 62 and regular, his respiratory rate is 18 and unlabored.  Oxygen saturation on room air is 96%.  GENERAL:  He looks well.  CARDIAC:  A regular rate and rhythm with normal heart sounds.  LUNG:  Clear.  CHEST:  Incision is healing well and sternum is stable.  EXTREMITIES:  His leg incision is healing well and there is no  peripheral edema.   Followup chest x-ray today shows clear lung fields and no pleural  effusions.   MEDICATIONS:  Carbidopa/levodopa 25/100 t.i.d., Coumadin 1.25 mg daily,  aspirin 81 mg daily, Lopressor 25 mg b.i.d., Crestor 20 mg at bedtime,  amiodarone 200 mg daily, stool softener and Metamucil daily, and  oxycodone p.r.n. for pain.  He is also on Crestor 20 mg at bedtime.   IMPRESSION:  Overall, the patient is making a slow but progressive  recovery following coronary bypass surgery.  He has been slowed by his  Parkinson disease.  I encouraged him to continue walking as much as  possible and he is planning on starting cardiac rehab.  I told him he  can return to driving a car, but should refrain  from lifting anything  heavier than 10 pounds for a  total of 3 months from date of surgery.  He will continue to follow up  with Dr. Reyes Ivan and will contact me if he develops any problems with his  incisions.   Evelene Croon, M.D.  Electronically Signed   BB/MEDQ  D:  01/04/2009  T:  01/05/2009  Job:  [id number]   cc:   Elmore Guise., M.D.

## 2011-02-25 NOTE — Discharge Summary (Signed)
NAME:  Harry Morgan, Harry Morgan NO.:  [id number]   MEDICAL RECORD NO.:  [id number]          PATIENT TYPE:  INP   LOCATION:  2015                         FACILITY:  [facility]   PHYSICIAN:  Evelene Croon, M.D.     DATE OF BIRTH:  Oct 07, 1928   DATE OF ADMISSION:  12/02/2008  DATE OF DISCHARGE:  12/13/2008                               DISCHARGE SUMMARY   HISTORY:  The patient is a 77 year old gentleman without prior cardiac  history.  Prior to admission, the previous couple of days he developed  episodes of chest discomfort.  This would happen with ambulation and be  resolved after a few minutes of rest.  The patient described the pain  primarily in the lower sternum portion of his chest and would get to a  level of 7/10 in intensity.  He denies this to be similar to previous  reflux symptoms which he has had in the past.  He described it primarily  as a sharp discomfort with no radiation to his jaw or arms.  There was  no associated nausea, vomiting, or diaphoresis.  The patient's wife  stated that he looked quite uncomfortable and he was unable to do his  usual activities.  He had no resting symptoms.  He had no palpitations,  presyncope or syncope.  He had no paroxysmal nocturnal dyspnea or  orthopnea.  He presented to the [facility] Room.  Reportedly,  there were no acute changes on his EKG.  He was treated with Lovenox,  nitroglycerin paste, beta-blocker and aspirin and became pain free and  was transferred to [facility] for further evaluation and treatment.  His  point-of-care markers were initially negative x1.   PAST MEDICAL HISTORY:  1. Hypertension.  2. Recently diagnosed Parkinson disease.  3. Degenerative joint disease.  4. History of nephrolithiasis.   PAST SURGICAL HISTORY:  1. Inguinal hernia repair x2 on the right and x1 on the left.  2. Bilateral arthroscopic knee surgery.   ALLERGIES:  None.   MEDICATIONS:  Prior to admission included,  1.  Metoprolol 100 mg b.i.d.  2. Benicar 20 mg at bedtime.  3. Hydrochlorothiazide 25 mg daily.  4. Carbidopa/levodopa 25/100 t.i.d.   Family history, social history, review of symptoms and physical exam,  please see the history and physical done at the time of admission.   HOSPITAL COURSE:  The patient was transferred for findings very  suspicious for unstable angina.  He was felt to require cardiac  catheterization.  This was done on December 05, 2007, by Dr. Reyes Ivan.  Findings were consistent with multivessel obstructive coronary artery  disease with preserved left ventricular function and an ejection  fraction of 55-60%.  For full details, please see the cardiac  catheterization report.  Due to these findings, surgical consultation  was obtained with Evelene Croon, MD who evaluated the patient and his  studies, and agreed with recommendations to proceed with surgical  revascularization as his best option.   PROCEDURE:  On December 06, 2008, he was taken to the operating room and  underwent the following procedure, coronary artery bypass  grafting x5.  The following grafts were placed.  1. Saphenous vein graft to obtuse marginal 1 and obtuse marginal 2.  2. Left internal mammary artery to the LAD.  3. Saphenous vein graft to the diagonal.  4. Saphenous vein graft to the right coronary artery.  The patient      tolerated the procedure well.  He was taken to the Surgical      Intensive Care Unit in stable condition.   POSTOPERATIVE HOSPITAL COURSE:  The patient has overall done well.  His  primary difficulty during the postoperative period has been atrial  fibrillation, but has subsequently been chemically cardioverted to a  normal sinus rhythm, which he is maintaining.  All routine lines,  monitors, and drainage devices have been discontinued in the standard  fashion.  His incisions are healing well.  He is tolerating gradual  increase in activity using standard protocols.  His oxygen  has been  weaned and he maintains good saturations on room air.  He was evaluated  for possible Coumadin use, but it has been determined that at this time,  it is probably more risk than benefit as it relates to chance of falling  in his age as associated to his Parkinson disease.  He does have a  postoperative anemia.  His most recent hemoglobin on December 11, 2008, is  9.3.  Currently, his status is felt to be tentatively stable for  discharge in the morning of December 13, 2008, pending morning round  reevaluation.   MEDICATIONS ON DISCHARGE:  Will be as follows,  1. Pepcid AC 1 at bedtime.  2. Aspirin 325 mg daily.  3. Sinemet 25/100 three times daily, his home dose.  4. Crestor 20 mg daily.  5. Amiodarone 400 mg 3 times daily for 7 days, then twice daily.  6. Lopressor 25 mg twice daily.  7. Lasix 40 mg daily for 5 days.  8. Potassium chloride 20 mEq daily for 5 days.  9. Oxycodone 5 mg 1-2 every 4 hours as needed for pain.   INSTRUCTIONS:  The patient will receive written instructions in regard  to medications, activity, diet, wound care, and followup.   FOLLOWUP:  Dr. Reyes Ivan in 2 weeks and Dr. Laneta Simmers on January 02, 2009, at  11:15.   FINAL DIAGNOSES:  Severe 3-vessel coronary artery disease as described  above.  He ruled out for myocardial infarction.  Most recent HDL 28 and  LDL 113, now status post surgical revascularization as described.   OTHER DIAGNOSES:  1. Hypertension.  2. Parkinson disease.  3. Degenerative joint disease.  4. Nephrolithiasis.  5. Postoperative atrial fibrillation.  6. Postoperative anemia.  7. Postoperative volume overload.  8. History of inguinal hernia repair x2 on the right and x1 on the      left.  9. History of bilateral arthroscopic knee surgery.      Rowe Clack, P.A.-C.      Evelene Croon, M.D.  Electronically Signed    WEG/MEDQ  D:  12/12/2008  T:  12/13/2008  Job:  [id number]   cc:   Evelene Croon, M.D.  Elmore Guise.,  M.D.  Olivia Canter, M.D.

## 2011-07-02 LAB — PREPARE PLATELET PHERESIS

## 2011-07-15 LAB — BASIC METABOLIC PANEL
CO2: 30
Calcium: 10
Creatinine, Ser: 1
GFR calc Af Amer: >60
GFR calc non Af Amer: >60
Sodium: 141

## 2011-07-15 LAB — DIFFERENTIAL
Basophils Absolute: 0
Basophils Relative: 0
Lymphocytes Relative: 29
Monocytes Absolute: 0.5
Monocytes Relative: 7
Neutro Abs: 4.2
Neutrophils Relative %: 62

## 2011-07-15 LAB — POCT CARDIAC MARKERS
CKMB, poc: 1.1
Myoglobin, poc: 89.7
Troponin i, poc: <0.05

## 2011-07-15 LAB — CBC
Hemoglobin: 15.5
MCHC: 34.1
RBC: 4.95
RDW: 12.2

## 2011-07-16 ENCOUNTER — Encounter: Payer: Self-pay | Admitting: Cardiology

## 2011-07-16 ENCOUNTER — Ambulatory Visit (INDEPENDENT_AMBULATORY_CARE_PROVIDER_SITE_OTHER): Payer: Medicare Other | Admitting: Cardiology

## 2011-07-16 ENCOUNTER — Ambulatory Visit: Payer: Medicare Other | Admitting: Cardiology

## 2011-07-16 DIAGNOSIS — I2510 Atherosclerotic heart disease of native coronary artery without angina pectoris: Secondary | ICD-10-CM | POA: Insufficient documentation

## 2011-07-16 DIAGNOSIS — I10 Essential (primary) hypertension: Secondary | ICD-10-CM

## 2011-07-16 DIAGNOSIS — E785 Hyperlipidemia, unspecified: Secondary | ICD-10-CM

## 2011-07-16 NOTE — Patient Instructions (Signed)
Continue your current medications.  Stay as active as you can.  I will see you again in 6 months.

## 2011-07-16 NOTE — Assessment & Plan Note (Signed)
Laboratory data is followed by his primary care physician. We will continue with Crestor.

## 2011-07-16 NOTE — Assessment & Plan Note (Signed)
Blood pressure is in excellent control. It has improved significantly since we started bystolic.

## 2011-07-16 NOTE — Progress Notes (Signed)
Harry Morgan Date of Birth: 04-14-1928   History of Present Illness: Harry Morgan is seen today for followup. He reports that he has been doing well. His activity is limited because of severe arthritis in his knees. He is able to ride a stationary bike for a little bit. He does walk with a cane. He denies any new medical problems on this visit.  Current Outpatient Prescriptions on File Prior to Visit  Medication Sig Dispense Refill  . aspirin 325 MG tablet Take 325 mg by mouth daily.        . carbidopa-levodopa (SINEMET) 25-100 MG per tablet Take 1 tablet by mouth 3 (three) times daily.        Marland Kitchen losartan-hydrochlorothiazide (HYZAAR) 100-25 MG per tablet Take 1 tablet by mouth daily.        . Nebivolol HCl (BYSTOLIC) 20 MG TABS Take 20 mg by mouth daily.        . rosuvastatin (CRESTOR) 20 MG tablet Take 20 mg by mouth daily.          No Known Allergies  Past Medical History  Diagnosis Date  . Coronary artery disease     Status post CABG. This included an LIMA graft to the LAD, saphenous vein graft to the diagonal,  saphenous vein graft to the first and second obtuse marginal vessels, saphenous vein graft to the distal right coronary  . Renal calculi   . Hypertension   . Dyslipidemia   . Atrial fibrillation     Postoperative  . Arthritis of spine   . FHx: coronary artery disease   . Parkinson disease     Past Surgical History  Procedure Date  . Coronary artery bypass graft 11/2008  . Hernia repair     Status post x3  . Knee surgery     Status post arthroscopic    History  Smoking status  . Former Smoker  Smokeless tobacco  . Not on file    History  Alcohol Use No    History reviewed. No pertinent family history.  Review of Systems: The review of systems is positive for severe arthritis in his knees. He has had prior injections and arthroscopic surgery.  All other systems were reviewed and are negative.  Physical Exam: BP 128/78  Pulse 80  Ht 6\' 1"   (1.854 m)  Wt 229 lb 12.8 oz (104.237 kg)  BMI 30.32 kg/m2 He is an elderly white male who is hard of hearing.The patient is alert and oriented x 3.  The mood and affect are normal.  The skin is warm and dry.  Color is normal.  The HEENT exam reveals that the sclera are nonicteric.  The mucous membranes are moist.  The carotids are 2+ without bruits.  There is no thyromegaly.  There is no JVD.  The lungs are clear.  The chest wall is non tender.  The heart exam reveals a regular rate with a normal S1 and S2.  There is a soft grade 1/6 systolic ejection murmur at the left sternal border.  The PMI is not displaced.   Abdominal exam reveals good bowel sounds.  There is no guarding or rebound.  There is no hepatosplenomegaly or tenderness.  There are no masses.  Exam of the legs reveal no clubbing, cyanosis, or edema.  The legs are without rashes.  The distal pulses are intact.  Cranial nerves II - XII are intact.  Motor and sensory functions are intact.  The gait is normal.  LABORATORY DATA:   Assessment / Plan:

## 2011-07-16 NOTE — Assessment & Plan Note (Signed)
He is status post CABG in February 2010. He remains asymptomatic. We will continue with his risk factor modification.

## 2012-01-13 ENCOUNTER — Ambulatory Visit (INDEPENDENT_AMBULATORY_CARE_PROVIDER_SITE_OTHER): Payer: Medicare Other | Admitting: Cardiology

## 2012-01-13 ENCOUNTER — Encounter: Payer: Self-pay | Admitting: Cardiology

## 2012-01-13 VITALS — BP 132/86 | HR 72 | Ht 73.0 in | Wt 226.0 lb

## 2012-01-13 DIAGNOSIS — E785 Hyperlipidemia, unspecified: Secondary | ICD-10-CM

## 2012-01-13 DIAGNOSIS — E7800 Pure hypercholesterolemia, unspecified: Secondary | ICD-10-CM

## 2012-01-13 DIAGNOSIS — I2510 Atherosclerotic heart disease of native coronary artery without angina pectoris: Secondary | ICD-10-CM

## 2012-01-13 DIAGNOSIS — Z951 Presence of aortocoronary bypass graft: Secondary | ICD-10-CM

## 2012-01-13 DIAGNOSIS — I10 Essential (primary) hypertension: Secondary | ICD-10-CM

## 2012-01-13 NOTE — Assessment & Plan Note (Signed)
Blood pressure is in excellent control on losartan HCTZ.

## 2012-01-13 NOTE — Patient Instructions (Signed)
Continue your current medication.  I will see you again in 6 months.   

## 2012-01-13 NOTE — Progress Notes (Signed)
[name] Date of Birth: 09/25/1928   History of Present Illness: [name] is seen today for followup. He reports that he has been doing well from a cardiac standpoint. He denies any chest pain or shortness of breath. He is limited because of severe arthritis in his knees. He states he can't walk or stand very long because of this. He does walk with a cane.  Current Outpatient Prescriptions on File Prior to Visit  Medication Sig Dispense Refill  . aspirin 325 MG tablet Take 325 mg by mouth daily.        . carbidopa-levodopa (SINEMET) 25-100 MG per tablet Take 1 tablet by mouth 3 (three) times daily.        Marland Kitchen losartan-hydrochlorothiazide (HYZAAR) 100-25 MG per tablet Take 1 tablet by mouth daily.        . Nebivolol HCl (BYSTOLIC) 20 MG TABS Take 20 mg by mouth daily.        . Psyllium (METAMUCIL PO) Take by mouth.        . rosuvastatin (CRESTOR) 20 MG tablet Take 20 mg by mouth daily.          No Known Allergies  Past Medical History  Diagnosis Date  . Coronary artery disease     Status post CABG. This included an LIMA graft to the LAD, saphenous vein graft to the diagonal,  saphenous vein graft to the first and second obtuse marginal vessels, saphenous vein graft to the distal right coronary  . Renal calculi   . Hypertension   . Dyslipidemia   . Atrial fibrillation     Postoperative  . Arthritis of spine   . FHx: coronary artery disease   . Parkinson disease     Past Surgical History  Procedure Date  . Coronary artery bypass graft 11/2008  . Hernia repair     Status post x3  . Knee surgery     Status post arthroscopic    History  Smoking status  . Former Smoker  Smokeless tobacco  . Not on file    History  Alcohol Use No    History reviewed. No pertinent family history.  Review of Systems: The review of systems is positive for severe arthritis in his knees. He also has pain in his low back..  All other systems were reviewed and are  negative.  Physical Exam: BP 132/86  Pulse 72  Ht 6\' 1"  (1.854 m)  Wt 226 lb (102.513 kg)  BMI 29.82 kg/m2 He is an elderly white male who is hard of hearing.The patient is alert and oriented x 3.  The mood and affect are normal.  The skin is warm and dry.  Color is normal.  The HEENT exam reveals that the sclera are nonicteric.  The mucous membranes are moist.  The carotids are 2+ without bruits.  There is no thyromegaly.  There is no JVD.  The lungs are clear.  The chest wall is non tender.  The heart exam reveals a regular rate with a normal S1 and S2.  There is a soft grade 1-2/6 systolic ejection murmur at the left sternal border.  The PMI is not displaced.   Abdominal exam reveals good bowel sounds.  There is no guarding or rebound.  There is no hepatosplenomegaly or tenderness.  There are no masses.  Exam of the legs reveal no clubbing, cyanosis, or edema.  The legs are without rashes.  The distal pulses are intact.  Cranial nerves II -  XII are intact.  Motor and sensory functions are intact.  The gait is normal. LABORATORY DATA: ECG today demonstrates normal sinus rhythm with nonspecific ST abnormality.  Assessment / Plan:

## 2012-01-13 NOTE — Assessment & Plan Note (Signed)
He is on chronic Crestor therapy. His labs are followed by his primary care.

## 2012-01-13 NOTE — Assessment & Plan Note (Signed)
He is status post CABG in 2010. He remains asymptomatic. We will continue with aspirin and statin therapy.

## 2012-07-23 ENCOUNTER — Encounter (HOSPITAL_COMMUNITY): Payer: Self-pay | Admitting: *Deleted

## 2012-07-23 ENCOUNTER — Telehealth: Payer: Self-pay | Admitting: Cardiology

## 2012-07-23 ENCOUNTER — Emergency Department (HOSPITAL_COMMUNITY)
Admission: EM | Admit: 2012-07-23 | Discharge: 2012-07-23 | Disposition: A | Discharge status: Home / Self Care | Payer: Medicare Other | Attending: Emergency Medicine | Admitting: Emergency Medicine

## 2012-07-23 ENCOUNTER — Emergency Department (HOSPITAL_COMMUNITY): Payer: Medicare Other

## 2012-07-23 DIAGNOSIS — R609 Edema, unspecified: Secondary | ICD-10-CM | POA: Insufficient documentation

## 2012-07-23 DIAGNOSIS — R0602 Shortness of breath: Secondary | ICD-10-CM

## 2012-07-23 DIAGNOSIS — Z79899 Other long term (current) drug therapy: Secondary | ICD-10-CM | POA: Insufficient documentation

## 2012-07-23 DIAGNOSIS — I10 Essential (primary) hypertension: Secondary | ICD-10-CM | POA: Insufficient documentation

## 2012-07-23 DIAGNOSIS — Z951 Presence of aortocoronary bypass graft: Secondary | ICD-10-CM | POA: Insufficient documentation

## 2012-07-23 DIAGNOSIS — Z7982 Long term (current) use of aspirin: Secondary | ICD-10-CM | POA: Insufficient documentation

## 2012-07-23 DIAGNOSIS — G20 Parkinson's disease: Secondary | ICD-10-CM | POA: Insufficient documentation

## 2012-07-23 DIAGNOSIS — G20A1 Parkinson's disease without dyskinesia, without mention of fluctuations: Secondary | ICD-10-CM | POA: Insufficient documentation

## 2012-07-23 DIAGNOSIS — E785 Hyperlipidemia, unspecified: Secondary | ICD-10-CM | POA: Insufficient documentation

## 2012-07-23 DIAGNOSIS — I509 Heart failure, unspecified: Secondary | ICD-10-CM | POA: Insufficient documentation

## 2012-07-23 DIAGNOSIS — I2510 Atherosclerotic heart disease of native coronary artery without angina pectoris: Secondary | ICD-10-CM | POA: Insufficient documentation

## 2012-07-23 LAB — CBC WITH DIFFERENTIAL/PLATELET
Hemoglobin: 14.9 g/dL (ref 13.0–17.0)
Lymphocytes Relative: 26 % (ref 12–46)
Lymphs Abs: 1.8 10*3/uL (ref 0.7–4.0)
MCH: 32.3 pg (ref 26.0–34.0)
Monocytes Relative: 8 % (ref 3–12)
Neutro Abs: 4.6 10*3/uL (ref 1.7–7.7)
Neutrophils Relative %: 65 % (ref 43–77)
RBC: 4.62 MIL/uL (ref 4.22–5.81)
WBC: 7.1 10*3/uL (ref 4.0–10.5)

## 2012-07-23 LAB — BASIC METABOLIC PANEL
BUN: 18 mg/dL (ref 6–23)
CO2: 29 mEq/L (ref 19–32)
Chloride: 100 mEq/L (ref 96–112)
Glucose, Bld: 108 mg/dL — ABNORMAL HIGH (ref 70–99)
Potassium: 3.7 mEq/L (ref 3.5–5.1)

## 2012-07-23 LAB — POCT I-STAT TROPONIN I
Troponin i, poc: 0 ng/mL (ref 0.00–0.08)
Troponin i, poc: 0.01 ng/mL (ref 0.00–0.08)

## 2012-07-23 NOTE — ED Notes (Signed)
Pt is here for increasing sob for one week.

## 2012-07-23 NOTE — ED Notes (Signed)
Pt states that he "needs more air".  Pt denies any CP with this.  Pt was not able to see his md

## 2012-07-23 NOTE — ED Notes (Signed)
Discharge note: pt dc to home via w/c.  Nad.  Family verbalizes understanding to dc instructions.

## 2012-07-23 NOTE — Telephone Encounter (Signed)
Spoke to patient's wife she stated husband has had sob,episodes of perspiring heavy for 1 week or more.No chest pain. States patient said he feels like he did when he had his heart surgery.Appointment scheduled with Norma Fredrickson NP 07/27/12.Advised take patient to [facility] ER if needed.

## 2012-07-23 NOTE — ED Provider Notes (Signed)
History     CSN: [id number]  Arrival date & time 07/23/12  1509   First MD Initiated Contact with Patient 07/23/12 1918      Chief Complaint  Patient presents with  . Shortness of Breath    (Consider location/radiation/quality/duration/timing/severity/associated sxs/prior treatment) HPI Comments: States that he feels smoothered.  Thinks it is worse with lying down.  Recently at the beach last week and that is where it started.  Also states he was eating a lot of bacon which is not usually in his diet.  Patient is a 78 y.o. male presenting with shortness of breath. The history is provided by the patient.  Shortness of Breath  The current episode started more than 1 week ago. The onset was gradual. The problem occurs frequently. The problem has been gradually worsening. The problem is moderate. The symptoms are relieved by cold air and rest. The symptoms are aggravated by a supine position. Associated symptoms include shortness of breath. Pertinent negatives include no chest pain, no chest pressure, no cough and no wheezing. His past medical history does not include asthma. There were no sick contacts. He has received no recent medical care.    Past Medical History  Diagnosis Date  . Coronary artery disease     Status post CABG. This included an LIMA graft to the LAD, saphenous vein graft to the diagonal,  saphenous vein graft to the first and second obtuse marginal vessels, saphenous vein graft to the distal right coronary  . Renal calculi   . Hypertension   . Dyslipidemia   . Atrial fibrillation     Postoperative  . Arthritis of spine   . FHx: coronary artery disease   . Parkinson disease   . CHF (congestive heart failure)     Past Surgical History  Procedure Date  . Coronary artery bypass graft 11/2008  . Hernia repair     Status post x3  . Knee surgery     Status post arthroscopic    No family history on file.  History  Substance Use Topics  . Smoking status:  Former Games developer  . Smokeless tobacco: Not on file  . Alcohol Use: No      Review of Systems  Respiratory: Positive for shortness of breath. Negative for cough and wheezing.   Cardiovascular: Negative for chest pain and leg swelling.  Gastrointestinal: Negative for nausea, vomiting and abdominal pain.       No dark stools  All other systems reviewed and are negative.    Allergies  Review of patient's allergies indicates no known allergies.  Home Medications   Current Outpatient Rx  Name Route Sig Dispense Refill  . ASPIRIN 325 MG PO TABS Oral Take 325 mg by mouth at bedtime.     Marland Kitchen CARBIDOPA-LEVODOPA 25-100 MG PO TABS Oral Take 1-2 tablets by mouth 3 (three) times daily. Take 1 tablet in the morning, 2 tablets at lunch,  and 1 tablet at night.    Marland Kitchen LOSARTAN POTASSIUM-HCTZ 100-25 MG PO TABS Oral Take 1 tablet by mouth daily.      . NEBIVOLOL HCL 20 MG PO TABS Oral Take 20 mg by mouth daily.      Marland Kitchen METAMUCIL PO Oral Take 5 mLs by mouth at bedtime.    Marland Kitchen ROSUVASTATIN CALCIUM 20 MG PO TABS Oral Take 20 mg by mouth at bedtime.       BP 142/99  Pulse 73  Temp 97.7 F (36.5 C) (Oral)  Resp 19  SpO2 100%  Physical Exam  Nursing note and vitals reviewed. Constitutional: He is oriented to person, place, and time. He appears well-developed and well-nourished. No distress.  HENT:  Head: Normocephalic and atraumatic.  Mouth/Throat: Oropharynx is clear and moist.  Eyes: Conjunctivae normal and EOM are normal. Pupils are equal, round, and reactive to light.  Neck: Normal range of motion. Neck supple.  Cardiovascular: Normal rate, regular rhythm and intact distal pulses.   No murmur heard. Pulmonary/Chest: Effort normal and breath sounds normal. No respiratory distress. He has no wheezes. He has no rales.       Healed sternotomy scar  Abdominal: Soft. He exhibits no distension. There is no tenderness. There is no rebound and no guarding.  Musculoskeletal: Normal range of motion. He  exhibits no tenderness.       Trace edema in lower ext  Neurological: He is alert and oriented to person, place, and time.  Skin: Skin is warm and dry. No rash noted. No erythema.  Psychiatric: He has a normal mood and affect. His behavior is normal.    ED Course  Procedures (including critical care time)  Labs Reviewed  CBC WITH DIFFERENTIAL - Abnormal; Notable for the following:    Platelets 147 (*)     All other components within normal limits  BASIC METABOLIC PANEL - Abnormal; Notable for the following:    Glucose, Bld 108 (*)     GFR calc non Af Amer 54 (*)     GFR calc Af Amer 63 (*)     All other components within normal limits  POCT I-STAT TROPONIN I  PRO B NATRIURETIC PEPTIDE  D-DIMER, QUANTITATIVE  POCT I-STAT TROPONIN I   Dg Chest 2 View  07/23/2012  *RADIOLOGY REPORT*  Clinical Data: Shortness of breath.  CHEST - 2 VIEW  Comparison: 01/04/2009  Findings: Prior CABG. Heart and mediastinal contours are within normal limits.  No focal opacities or effusions.  No acute bony abnormality.  IMPRESSION: No active cardiopulmonary disease.   Original Report Authenticated By: Cyndie Chime, M.D.     Date: 07/23/2012  Rate: 73  Rhythm: normal sinus rhythm and sinus arrhythmia  QRS Axis: normal  Intervals: normal  ST/T Wave abnormalities: nonspecific T wave changes  Conduction Disutrbances:none  Narrative Interpretation:   Old EKG Reviewed: none available    No diagnosis found.    MDM   Patient presenting with worsening shortness of breath over the last week and a half. He denies any chest pain with the shortness of breath and seems to think it is worse when he tries to lie down. He denies any peripheral edema but does have a significant heart history of a CABG 2 years ago. He states that he seems to think his symptoms were like this prior to his CABG. Patient did recently take a trip to the beach this could be a PE versus cardiac pathology such as CHF. Troponin and EKG  are unrevealing today. CBC, BMP, d-dimer, BN P. Pending.  CXR wnl.  10:18 PM All labs wnl.  Second troponin at 6 hours was neg.  10:57 PM Second troponin neg.  Spoke with Dr. Antoine Poche and pt will f/u as outpt.  Denies any sx now and well appearing.  Will d/c home.    Gwyneth Sprout, MD 07/23/12 2257

## 2012-07-23 NOTE — ED Notes (Signed)
Pt states he cont. To be short of breath.  Pt denies any pain at this time.

## 2012-07-23 NOTE — Telephone Encounter (Signed)
plz return call to pt wife Harry Morgan [phone] regarding pt complaints of sweaty, SOB, Smothered feeling, no chest pains

## 2012-07-27 ENCOUNTER — Encounter: Payer: Self-pay | Admitting: Nurse Practitioner

## 2012-07-27 ENCOUNTER — Ambulatory Visit (INDEPENDENT_AMBULATORY_CARE_PROVIDER_SITE_OTHER): Payer: Medicare Other | Admitting: Nurse Practitioner

## 2012-07-27 VITALS — BP 130/80 | HR 60 | Ht 72.0 in | Wt 218.4 lb

## 2012-07-27 DIAGNOSIS — R0989 Other specified symptoms and signs involving the circulatory and respiratory systems: Secondary | ICD-10-CM

## 2012-07-27 DIAGNOSIS — R0600 Dyspnea, unspecified: Secondary | ICD-10-CM

## 2012-07-27 DIAGNOSIS — R0609 Other forms of dyspnea: Secondary | ICD-10-CM

## 2012-07-27 NOTE — Patient Instructions (Addendum)
Continue with your current medicines.  Try to check your blood pressure during one of your spells  We are going to arrange for a stress test (lexiscan)  Try to cut back the salt  Dr. [place] will see you next month as planned.  Call the [facility] office at [phone] if you have any questions, problems or concerns.

## 2012-07-27 NOTE — Progress Notes (Signed)
Harry Morgan Date of Birth: [birth year] Medical Record #[id number]  History of Present Illness: [name] is seen today for a work in visit. He is seen for Dr. [place]. He has known CAD with prior CABG in 2010. Other issues include HTN and HLD. He is limited by his arthritis. His EF was normal at the time of his cath 2 years ago.   He comes in today. He is here with family. His wife called last week to report spells of sweating, dyspnea and a smothery feeling. No chest pain reported. He was in the ER on the 11th of October and had negative cardiac enzymes, negative d dimer and a negative BNP. EKG was ok. CXR was ok. Had been to the beach and had had some extra salt use. He says these spells have just started over the past 1 to 2 weeks. Started at [employer] and he had had more salt. Seemed to be worse after he got home and then told his wife. He gets "hot" and feels like he needs to cool off. Not really dizzy. No palpitations.  Feels smothery. Doesn't really know how long it lasts. Does not wake him up at night. Has had a long standing cough that may be improved. No swelling. His appetite is off and he does not eat as much as he has in the past. Weight is down. His Parkinson's may be a little worse. He is not active. No falls. His blood pressure readings from home show some labile readings which may be more of a reflection of his Parkinson's. He has not checked his blood pressure during any of these episodes of dyspnea.   Current Outpatient Prescriptions on File Prior to Visit  Medication Sig Dispense Refill  . aspirin 325 MG tablet Take 325 mg by mouth at bedtime.       . carbidopa-levodopa (SINEMET) 25-100 MG per tablet Take 1-2 tablets by mouth 3 (three) times daily. Take 1 tablet in the morning, 2 tablets at lunch,  and 1 tablet at night.      . losartan-hydrochlorothiazide (HYZAAR) 100-25 MG per tablet Take 1 tablet by mouth daily.        . Nebivolol HCl (BYSTOLIC) 20 MG TABS Take 20 mg by  mouth daily.        . Psyllium (METAMUCIL PO) Take 5 mLs by mouth at bedtime.      . rosuvastatin (CRESTOR) 20 MG tablet Take 20 mg by mouth at bedtime.         No Known Allergies  Past Medical History  Diagnosis Date  . Coronary artery disease 2010    Status post CABG. This included an LIMA graft to the LAD, saphenous vein graft to the diagonal,  saphenous vein graft to the first and second obtuse marginal vessels, saphenous vein graft to the distal right coronary  . Renal calculi   . Hypertension   . Dyslipidemia   . Atrial fibrillation     Postoperative  . Arthritis of spine   . FHx: coronary artery disease   . Parkinson disease   . CHF (congestive heart failure)     Past Surgical History  Procedure Date  . Coronary artery bypass graft 11/2008  . Hernia repair     Status post x3  . Knee surgery     Status post arthroscopic    History  Smoking status  . Former Smoker  Smokeless tobacco  . Not on file    History  Alcohol  Use No    History reviewed. No pertinent family history.  Review of Systems: The review of systems is per the HPI.  All other systems were reviewed and are negative.  Physical Exam: BP 130/80  Pulse 60  Ht 6' (1.829 m)  Wt 218 lb 6.4 oz (99.066 kg)  BMI 29.62 kg/m2  SpO2 98% Patient is very pleasant and in no acute distress. He has a resting tremor. Skin is warm and dry. Color is normal.  HEENT is unremarkable. Normocephalic/atraumatic. PERRL. Sclera are nonicteric. Neck is supple. No masses. No JVD. Lungs are clear. Cardiac exam shows a regular rate and rhythm. Abdomen is soft. Extremities are without edema. Gait is unsteady. He is using a cane. ROM appears intact. No gross neurologic deficits noted.   LABORATORY DATA:   Chemistry      Component Value Date/Time   NA 139 07/23/2012 2002   K 3.7 07/23/2012 2002   CL 100 07/23/2012 2002   CO2 29 07/23/2012 2002   BUN 18 07/23/2012 2002   CREATININE 1.21 07/23/2012 2002      Component  Value Date/Time   CALCIUM 10.3 07/23/2012 2002   ALKPHOS 89 12/02/2008 1840   AST 23 12/02/2008 1840   ALT 10 12/02/2008 1840   BILITOT 0.9 12/02/2008 1840     Lab Results  Component Value Date   WBC 7.1 07/23/2012   HGB 14.9 07/23/2012   HCT 43.0 07/23/2012   MCV 93.1 07/23/2012   PLT 147* 07/23/2012   Dg Chest 2 View  07/23/2012  *RADIOLOGY REPORT*  Clinical Data: Shortness of breath.  CHEST - 2 VIEW  Comparison: 01/04/2009  Findings: Prior CABG. Heart and mediastinal contours are within normal limits.  No focal opacities or effusions.  No acute bony abnormality.  IMPRESSION: No active cardiopulmonary disease.   Original Report Authenticated By: Cyndie Chime, M.D.    Assessment / Plan:  1. Dyspnea - uncertain etiology.   2. CAD - prior CABG  3. HTN - some labile readings at home noted.   4. Parkinson's - may be a little worse according to the wife.   We are going to arrange for a Lexiscan. That will give [place] an EF as well as to evaluate for possible ischemia etiology. He is to check his blood pressure, especially with one of his spells. For now, no change in his medicines but we will try split dosing his Bystolic since samples are made available today. He is to see Dr. Sw[place]il[place] next month.   Patient and his wife are agreeable to this plan and will call if any problems develop in the interim.

## 2012-07-29 ENCOUNTER — Telehealth: Payer: Self-pay | Admitting: Cardiology

## 2012-07-29 NOTE — Telephone Encounter (Signed)
Pt can't sleep and needs something to sleep they tried calling the pcp but he will not be in until Monday and he can't wait

## 2012-07-30 NOTE — Telephone Encounter (Signed)
Patient called 07/29/12 spoke to wife.She stated patient is having trouble sleeping at night,sob.Stated he has appointment with Dr.Kelly's PA 07/30/12.Advised to keep appointment.Advised to keep appointment for [facility] 08/04/12.

## 2012-07-30 NOTE — Telephone Encounter (Signed)
Spoke to wife she stated patient saw Dr.Kelly today 07/30/12 and was told to take melatonin to help him sleep.Wife stated she thinks patient gets anxious.Advised to have patient keep appointment for [facility] 08/04/12.

## 2012-08-04 ENCOUNTER — Ambulatory Visit (HOSPITAL_COMMUNITY): Payer: Medicare Other | Attending: Cardiovascular Disease | Admitting: Radiology

## 2012-08-04 VITALS — BP 151/93 | HR 76 | Ht 72.0 in | Wt 214.0 lb

## 2012-08-04 DIAGNOSIS — E785 Hyperlipidemia, unspecified: Secondary | ICD-10-CM | POA: Insufficient documentation

## 2012-08-04 DIAGNOSIS — Z8249 Family history of ischemic heart disease and other diseases of the circulatory system: Secondary | ICD-10-CM | POA: Insufficient documentation

## 2012-08-04 DIAGNOSIS — I2510 Atherosclerotic heart disease of native coronary artery without angina pectoris: Secondary | ICD-10-CM

## 2012-08-04 DIAGNOSIS — R0600 Dyspnea, unspecified: Secondary | ICD-10-CM

## 2012-08-04 DIAGNOSIS — I4891 Unspecified atrial fibrillation: Secondary | ICD-10-CM | POA: Insufficient documentation

## 2012-08-04 DIAGNOSIS — R61 Generalized hyperhidrosis: Secondary | ICD-10-CM | POA: Insufficient documentation

## 2012-08-04 DIAGNOSIS — R0602 Shortness of breath: Secondary | ICD-10-CM

## 2012-08-04 DIAGNOSIS — R0989 Other specified symptoms and signs involving the circulatory and respiratory systems: Secondary | ICD-10-CM | POA: Insufficient documentation

## 2012-08-04 DIAGNOSIS — Z87891 Personal history of nicotine dependence: Secondary | ICD-10-CM | POA: Insufficient documentation

## 2012-08-04 DIAGNOSIS — R0609 Other forms of dyspnea: Secondary | ICD-10-CM

## 2012-08-04 DIAGNOSIS — I10 Essential (primary) hypertension: Secondary | ICD-10-CM | POA: Insufficient documentation

## 2012-08-04 DIAGNOSIS — R42 Dizziness and giddiness: Secondary | ICD-10-CM | POA: Insufficient documentation

## 2012-08-04 DIAGNOSIS — Z951 Presence of aortocoronary bypass graft: Secondary | ICD-10-CM | POA: Insufficient documentation

## 2012-08-04 MED ORDER — REGADENOSON 0.4 MG/5ML IV SOLN
0.4000 mg | Freq: Once | INTRAVENOUS | Status: AC
  Administered 2012-08-04: 0.4 mg via INTRAVENOUS

## 2012-08-04 MED ORDER — TECHNETIUM TC 99M SESTAMIBI GENERIC - CARDIOLITE
11.0000 | Freq: Once | INTRAVENOUS | Status: AC | PRN
  Administered 2012-08-04: 11 via INTRAVENOUS

## 2012-08-04 MED ORDER — TECHNETIUM TC 99M SESTAMIBI GENERIC - CARDIOLITE
33.0000 | Freq: Once | INTRAVENOUS | Status: AC | PRN
  Administered 2012-08-04: 33 via INTRAVENOUS

## 2012-08-04 NOTE — Progress Notes (Signed)
[facility] SITE 3 NUCLEAR MED [address] [id number] [place] [address] [phone]  Cardiology Nuclear Med Study  Harry Morgan is a 78 y.o. male     MRN : [id number]     DOB: 01/05/1928  Procedure Date: 08/04/2012  Nuclear Med Background Indication for Stress Test:  Evaluation for Ischemia, Graft Patency and Patient in ED on 07/23/12 for Shortness of Breath with Negative Enzymes  History:  '10 CABG, EF=60%, Post Op Atrial Fibrillation; Cardiac Risk Factors: Family History - CAD, History of Smoking, Hypertension and Lipids  Symptoms:  Diaphoresis, Dizziness, DOE and SOB   Nuclear Pre-Procedure Caffeine/Decaff Intake:  None > 12 hrs NPO After: 9:30pm   Lungs:  Clear. O2 Sat: 97% on room air. IV 0.9% NS with Angio Cath:  22g  IV Site: R Wrist x 1, tolerated well IV Started by:  Irean Hong, RN  Chest Size (in):  40 Cup Size: n/a  Height: 6' (1.829 m)  Weight:  214 lb (97.07 kg)  BMI:  Body mass index is 29.02 kg/(m^2). 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    Nuclear Med Study 1 or 2 day study: 1 day  Stress Test Type:  Lexiscan  Reading MD: Charlton Haws, MD  Order Authorizing Provider:  Atoya Andrew [place], MD  Resting Radionuclide: Technetium 47m Sestamibi  Resting Radionuclide Dose: 11.0 mCi   Stress Radionuclide:  Technetium 59m Sestamibi  Stress Radionuclide Dose: 32.9 mCi           Stress Protocol Rest HR: 76 Stress HR: 101  Rest BP: 151/93 Stress BP: 145/102  Exercise Time (min): n/a METS: n/a   Predicted Max HR: 138 bpm % Max HR: 73.19 bpm Rate Pressure Product: 62952   Dose of Adenosine (mg):  n/a Dose of Lexiscan: 0.4 mg  Dose of Atropine (mg): n/a Dose of Dobutamine: n/a mcg/kg/min (at max HR)  Stress Test Technologist: Smiley Houseman, CMA-N  Nuclear Technologist:  Domenic Polite, CNMT     Rest Procedure:  Myocardial perfusion imaging was performed at rest 45 minutes following the intravenous administration of Technetium 24m  Sestamibi.  Rest ECG: Nonspecific ST-T wave changes.  Stress Procedure:  The patient received IV Lexiscan 0.4 mg over 15-seconds.  Technetium 38m Sestamibi was injected at 30-seconds.  There were no significant changes with Lexiscan.  Quantitative spect images were obtained after a 45 minute delay.  Stress ECG: No significant change from baseline ECG  QPS Raw Data Images:  Normal; no motion artifact; normal heart/lung ratio. Stress Images:  Normal homogeneous uptake in all areas of the myocardium. Rest Images:  Normal homogeneous uptake in all areas of the myocardium. Subtraction (SDS):  Normal Transient Ischemic Dilatation (Normal <1.22):  0.81 Lung/Heart Ratio (Normal <0.45):  0.30  Quantitative Gated Spect Images QGS EDV:  57 ml QGS ESV:  17 ml  Impression Exercise Capacity:  Lexiscan with no exercise. BP Response:  Normal blood pressure response. Clinical Symptoms:  There is dyspnea. ECG Impression:  No significant ST segment change suggestive of ischemia. Comparison with Prior Nuclear Study: No previous nuclear study performed  Overall Impression:  Normal stress nuclear study.  LV Ejection Fraction: 70%.  LV Wall Motion:  NL LV Function; NL Wall Motion   Charlton Haws

## 2012-08-11 ENCOUNTER — Emergency Department (HOSPITAL_BASED_OUTPATIENT_CLINIC_OR_DEPARTMENT_OTHER)
Admission: EM | Admit: 2012-08-11 | Discharge: 2012-08-11 | Disposition: A | Discharge status: Home / Self Care | Payer: Medicare Other | Attending: Emergency Medicine | Admitting: Emergency Medicine

## 2012-08-11 ENCOUNTER — Encounter (HOSPITAL_BASED_OUTPATIENT_CLINIC_OR_DEPARTMENT_OTHER): Payer: Self-pay | Admitting: *Deleted

## 2012-08-11 DIAGNOSIS — Z7982 Long term (current) use of aspirin: Secondary | ICD-10-CM | POA: Insufficient documentation

## 2012-08-11 DIAGNOSIS — I509 Heart failure, unspecified: Secondary | ICD-10-CM | POA: Insufficient documentation

## 2012-08-11 DIAGNOSIS — R259 Unspecified abnormal involuntary movements: Secondary | ICD-10-CM | POA: Insufficient documentation

## 2012-08-11 DIAGNOSIS — G20A1 Parkinson's disease without dyskinesia, without mention of fluctuations: Secondary | ICD-10-CM | POA: Insufficient documentation

## 2012-08-11 DIAGNOSIS — I10 Essential (primary) hypertension: Secondary | ICD-10-CM | POA: Insufficient documentation

## 2012-08-11 DIAGNOSIS — Z79899 Other long term (current) drug therapy: Secondary | ICD-10-CM | POA: Insufficient documentation

## 2012-08-11 DIAGNOSIS — Z87891 Personal history of nicotine dependence: Secondary | ICD-10-CM | POA: Insufficient documentation

## 2012-08-11 DIAGNOSIS — I2510 Atherosclerotic heart disease of native coronary artery without angina pectoris: Secondary | ICD-10-CM | POA: Insufficient documentation

## 2012-08-11 DIAGNOSIS — F411 Generalized anxiety disorder: Secondary | ICD-10-CM | POA: Insufficient documentation

## 2012-08-11 DIAGNOSIS — Z8739 Personal history of other diseases of the musculoskeletal system and connective tissue: Secondary | ICD-10-CM | POA: Insufficient documentation

## 2012-08-11 DIAGNOSIS — G20 Parkinson's disease: Secondary | ICD-10-CM | POA: Insufficient documentation

## 2012-08-11 DIAGNOSIS — Z951 Presence of aortocoronary bypass graft: Secondary | ICD-10-CM | POA: Insufficient documentation

## 2012-08-11 DIAGNOSIS — F419 Anxiety disorder, unspecified: Secondary | ICD-10-CM

## 2012-08-11 DIAGNOSIS — E785 Hyperlipidemia, unspecified: Secondary | ICD-10-CM | POA: Insufficient documentation

## 2012-08-11 MED ORDER — LORAZEPAM 1 MG PO TABS: 0.5000 mg | ORAL_TABLET | Freq: Three times a day (TID) | ORAL | Status: DC | PRN

## 2012-08-11 NOTE — ED Notes (Addendum)
Pt c/o increased tremors x 1 week also c/o constipation, and insomnia. NEg stress x 1 week ago, HX of parkinsons

## 2012-08-11 NOTE — ED Provider Notes (Signed)
History     CSN: [id number]  Arrival date & time 08/11/12  1540   First MD Initiated Contact with Patient 08/11/12 1543      Chief Complaint  Patient presents with  . Tremors  . Constipation    Patient is a 78 y.o. male presenting with constipation. The history is provided by the patient and the spouse.  Constipation  The current episode started more than 1 week ago. The onset was gradual. The problem has been gradually improving. The patient is experiencing no pain. The stool is described as hard. Pertinent negatives include no fever, no abdominal pain, no nausea, no vomiting and no chest pain.  Pt here for two complaints - constipation for past two weeks.  He reports small BM yesterday - but no abd pain and no vomiting.    He also reports increased tremor - he has h/o parkinson for years, but feels "shaky" at night.  No falls.  No focal weakness.  No visual changes.  No HA.  Wife thinks he may be having panic attacks at night.  He denies orthopnea or any other SOB.  No CP is reported.  PCP gave patient Remus Loffler but he refuses to take it.  He has f/u with neuro next week for recheck.  He takes sinemet for parkinson.    Past Medical History  Diagnosis Date  . Coronary artery disease 2010    Status post CABG. This included an LIMA graft to the LAD, saphenous vein graft to the diagonal,  saphenous vein graft to the first and second obtuse marginal vessels, saphenous vein graft to the distal right coronary  . Renal calculi   . Hypertension   . Dyslipidemia   . Atrial fibrillation     Postoperative  . Arthritis of spine   . FHx: coronary artery disease   . Parkinson disease   . CHF (congestive heart failure)     Past Surgical History  Procedure Date  . Coronary artery bypass graft 11/2008  . Hernia repair     Status post x3  . Knee surgery     Status post arthroscopic    History reviewed. No pertinent family history.  History  Substance Use Topics  . Smoking status: Former  Games developer  . Smokeless tobacco: Not on file  . Alcohol Use: No      Review of Systems  Constitutional: Negative for fever.  Respiratory: Negative for shortness of breath.   Cardiovascular: Negative for chest pain.  Gastrointestinal: Positive for constipation. Negative for nausea, vomiting and abdominal pain.  Neurological: Positive for tremors.  All other systems reviewed and are negative.    Allergies  Review of patient's allergies indicates no known allergies.  Home Medications   Current Outpatient Rx  Name Route Sig Dispense Refill  . ASPIRIN 325 MG PO TABS Oral Take 325 mg by mouth at bedtime.     Marland Kitchen CARBIDOPA-LEVODOPA 25-100 MG PO TABS Oral Take 1-2 tablets by mouth 3 (three) times daily. Take 1 tablet in the morning, 2 tablets at lunch,  and 1 tablet at night.    Marland Kitchen CASANTHRANOL-DOCUSATE SODIUM 30-100 MG PO CAPS Oral Take by mouth daily.    Marland Kitchen LORAZEPAM 1 MG PO TABS Oral Take 0.5 tablets (0.5 mg total) by mouth every 8 (eight) hours as needed for anxiety. 8 tablet 0  . LOSARTAN POTASSIUM-HCTZ 100-25 MG PO TABS Oral Take 1 tablet by mouth daily.      . NEBIVOLOL HCL 20 MG PO TABS  Oral Take 20 mg by mouth daily.      Marland Kitchen METAMUCIL PO Oral Take 5 mLs by mouth at bedtime.    Marland Kitchen ROSUVASTATIN CALCIUM 20 MG PO TABS Oral Take 20 mg by mouth at bedtime.       BP 133/83  Pulse 82  Temp 97.6 F (36.4 C) (Oral)  Resp 16  Ht 6' (1.829 m)  Wt 215 lb (97.523 kg)  BMI 29.16 kg/m2  SpO2 100%  Physical Exam CONSTITUTIONAL: Well developed/well nourished HEAD AND FACE: Normocephalic/atraumatic EYES: EOMI/PERRL ENMT: Mucous membranes moist NECK: supple no meningeal signs SPINE:entire spine nontender CV: S1/S2 noted, no murmurs/rubs/gallops noted LUNGS: Lungs are clear to auscultation bilaterally, no apparent distress ABDOMEN: soft, nontender, no rebound or guarding, +BS Rectal - small stool impaction noted.  No blood noted.  No tenderness.  Chaperone present GU:no cva  tenderness NEURO: Pt is awake/alert, moves all extremitiesx4. No arm/leg drift.  Mild Tremor noted in all extremities EXTREMITIES: pulses normal, full ROM SKIN: warm, color normal PSYCH: no abnormalities of mood noted  ED Course  Fecal disimpaction Performed by: Joya Gaskins Authorized by: Joya Gaskins Consent: Verbal consent obtained. Local anesthesia used: no Patient sedated: no Patient tolerance: Patient tolerated the procedure well with no immediate complications. Comments: Partial fecal disimpaction completed with chaperone present no complications     1. Anxiety       MDM  Nursing notes including past medical history and social history reviewed and considered in documentation Previous records reviewed and considered - recent labs noted  For constipation, pt has no signs of bowel obstruction, well appearing  For tremor - pt admits this has been present for awhile but just "feels shaky" at night.  He reports it disrupts sleep.  He refuses ambien.  We discussed possibility of adding low dose of ativan and to take several hrs prior to sleep.  I advised at length precautions with this drug including increased fall risk and sedation.  Wife and patient understand this and will try 0.5mg  of ativan and will see neurologist next week.           Joya Gaskins, MD 08/11/12 [phone]

## 2012-08-27 ENCOUNTER — Encounter: Payer: Self-pay | Admitting: Cardiology

## 2012-08-27 ENCOUNTER — Ambulatory Visit (INDEPENDENT_AMBULATORY_CARE_PROVIDER_SITE_OTHER): Payer: Medicare Other | Admitting: Cardiology

## 2012-08-27 VITALS — BP 130/83 | HR 76 | Ht 72.0 in | Wt 216.0 lb

## 2012-08-27 DIAGNOSIS — I10 Essential (primary) hypertension: Secondary | ICD-10-CM

## 2012-08-27 DIAGNOSIS — I2510 Atherosclerotic heart disease of native coronary artery without angina pectoris: Secondary | ICD-10-CM

## 2012-08-27 DIAGNOSIS — E785 Hyperlipidemia, unspecified: Secondary | ICD-10-CM

## 2012-08-27 NOTE — Progress Notes (Signed)
Harry Morgan Date of Birth: [birth year] Medical Record #[id number]  History of Present Illness: Harry Morgan is seen today for a followup visit. He has known CAD with prior CABG in 2010. Other issues include HTN and HLD. He is limited by his arthritis and Parkinson's. He was seen in October with a multitude of complaints. He been to the emergency room where extensive laboratory evaluation was unremarkable. He complained of shortness of breath. He had difficulty sleeping. Since being started on Xanax by neurology he has done much better. He has no complaints on followup today.  Current Outpatient Prescriptions on File Prior to Visit  Medication Sig Dispense Refill  . aspirin 325 MG tablet Take 325 mg by mouth at bedtime.       . carbidopa-levodopa (SINEMET) 25-100 MG per tablet Take 1-2 tablets by mouth as directed. Take 1 tablet in the morning, 1 tablets at lunch, 1 at 4pm and 1 tablet at night.      . losartan-hydrochlorothiazide (HYZAAR) 100-25 MG per tablet Take 1 tablet by mouth daily.        . Nebivolol HCl (BYSTOLIC) 20 MG TABS Take 20 mg by mouth daily.        . Psyllium (METAMUCIL PO) Take 5 mLs by mouth at bedtime.      . rosuvastatin (CRESTOR) 20 MG tablet Take 20 mg by mouth at bedtime.         No Known Allergies  Past Medical History  Diagnosis Date  . Coronary artery disease 2010    Status post CABG. This included an LIMA graft to the LAD, saphenous vein graft to the diagonal,  saphenous vein graft to the first and second obtuse marginal vessels, saphenous vein graft to the distal right coronary  . Renal calculi   . Hypertension   . Dyslipidemia   . Atrial fibrillation     Postoperative  . Arthritis of spine   . FHx: coronary artery disease   . Parkinson disease   . CHF (congestive heart failure)     Past Surgical History  Procedure Date  . Coronary artery bypass graft 11/2008  . Hernia repair     Status post x3  . Knee surgery     Status post arthroscopic     History  Smoking status  . Former Smoker  Smokeless tobacco  . Not on file    History  Alcohol Use No    History reviewed. No pertinent family history.  Review of Systems: The review of systems is per the HPI.  All other systems were reviewed and are negative.  Physical Exam: BP 130/83  Pulse 76  Ht 6' (1.829 m)  Wt 97.977 kg (216 lb)  BMI 29.29 kg/m2  SpO2 99% Patient is very pleasant and in no acute distress. He has a resting tremor. Skin is warm and dry. Color is normal.  HEENT is unremarkable. Normocephalic/atraumatic. PERRL. Sclera are nonicteric. Neck is supple. No masses. No JVD. Lungs are clear. Cardiac exam shows a regular rate and rhythm. Abdomen is soft. Extremities are without edema. Neurologic exam reveals a tremor. Gait is unsteady. He is using a cane. ROM appears intact. No gross neurologic deficits noted.   LABORATORY DATA: LexiScan Myoview study this past month was normal.  Assessment / Plan:  1. Dyspnea - now resolved. There appears to be a significant anxiety component.  2. CAD - prior CABG, normal Myoview study this past month.  3. HTN - some labile readings at home noted.  4. Parkinson's

## 2012-08-27 NOTE — Patient Instructions (Signed)
Continue your current therapy  I will see you again in six months.

## 2013-03-03 ENCOUNTER — Encounter: Payer: Self-pay | Admitting: Cardiology

## 2013-03-03 ENCOUNTER — Ambulatory Visit (INDEPENDENT_AMBULATORY_CARE_PROVIDER_SITE_OTHER): Payer: Medicare Other | Admitting: Cardiology

## 2013-03-03 VITALS — BP 114/64 | HR 70 | Ht 72.0 in | Wt 215.8 lb

## 2013-03-03 DIAGNOSIS — I2510 Atherosclerotic heart disease of native coronary artery without angina pectoris: Secondary | ICD-10-CM

## 2013-03-03 DIAGNOSIS — I10 Essential (primary) hypertension: Secondary | ICD-10-CM

## 2013-03-03 DIAGNOSIS — E785 Hyperlipidemia, unspecified: Secondary | ICD-10-CM

## 2013-03-03 NOTE — Patient Instructions (Signed)
Continue your current therapy  I will see you in 6 months.   

## 2013-03-03 NOTE — Progress Notes (Signed)
Harry Morgan Date of Birth: Jul 19, 1928 Medical Record #[id number]  History of Present Illness: Harry Morgan is seen today for a followup visit. He has known CAD with prior CABG in February 2010. Other issues include HTN and HLD. He is limited by his arthritis and Parkinson's.  On followup today he is doing very well. He reports that his bystolic dose was reduced 3-4 months ago because of low blood pressure readings. He brings an extensive blood pressure diary today which indicates excellent blood pressure control. He has had no significant chest pain or shortness of breath.  Current Outpatient Prescriptions on File Prior to Visit  Medication Sig Dispense Refill  . ALPRAZolam (XANAX) 0.5 MG tablet Take 0.5 mg by mouth as directed.       Marland Kitchen aspirin 325 MG tablet Take 325 mg by mouth at bedtime.       . carbidopa-levodopa (SINEMET) 25-100 MG per tablet Take 1-2 tablets by mouth as directed. Take 1 tablet in the morning, 1 tablets at lunch, 1 at 4pm and 1 tablet at night.      . losartan-hydrochlorothiazide (HYZAAR) 100-25 MG per tablet Take 1 tablet by mouth daily.        . Nebivolol HCl (BYSTOLIC) 20 MG TABS Take 10 mg by mouth daily.       . polyethylene glycol powder (GLYCOLAX/MIRALAX) powder       . Psyllium (METAMUCIL PO) Take 5 mLs by mouth at bedtime.      . rosuvastatin (CRESTOR) 20 MG tablet Take 20 mg by mouth at bedtime.        No current facility-administered medications on file prior to visit.    No Known Allergies  Past Medical History  Diagnosis Date  . Coronary artery disease 2010    Status post CABG. This included an LIMA graft to the LAD, saphenous vein graft to the diagonal,  saphenous vein graft to the first and second obtuse marginal vessels, saphenous vein graft to the distal right coronary  . Renal calculi   . Hypertension   . Dyslipidemia   . Atrial fibrillation     Postoperative  . Arthritis of spine   . FHx: coronary artery disease   . Parkinson disease    . CHF (congestive heart failure)     Past Surgical History  Procedure Laterality Date  . Coronary artery bypass graft  11/2008  . Hernia repair      Status post x3  . Knee surgery      Status post arthroscopic    History  Smoking status  . Former Smoker  Smokeless tobacco  . Not on file    History  Alcohol Use No    History reviewed. No pertinent family history.  Review of Systems: The review of systems is per the HPI.  All other systems were reviewed and are negative.  Physical Exam: BP 114/64  Pulse 70  Ht 6' (1.829 m)  Wt 215 lb 12.8 oz (97.886 kg)  BMI 29.26 kg/m2  SpO2 95% Patient is very pleasant and in no acute distress. He has a resting tremor. Skin is warm and dry. Color is normal.  HEENT is unremarkable. Normocephalic/atraumatic. PERRL. Sclera are nonicteric. Neck is supple. No masses. No JVD. Lungs are clear. Cardiac exam shows a regular rate and rhythm. Abdomen is soft. Extremities are without edema. Neurologic exam reveals a tremor. Gait is unsteady. He is using a cane. ROM appears intact. No gross neurologic deficits noted.   LABORATORY DATA:  Assessment / Plan:  1.  CAD - prior CABG, normal Myoview study October 2013. Continue current medical therapy.  2. HTN - blood pressure under excellent control on lower dose of bystolic  3. Parkinson's disease  4. Hyperlipidemia-on Crestor.

## 2013-08-31 ENCOUNTER — Encounter: Payer: Self-pay | Admitting: Cardiology

## 2013-08-31 ENCOUNTER — Ambulatory Visit (INDEPENDENT_AMBULATORY_CARE_PROVIDER_SITE_OTHER): Payer: Medicare Other | Admitting: Cardiology

## 2013-08-31 VITALS — BP 150/90 | HR 68 | Ht 72.0 in | Wt 214.0 lb

## 2013-08-31 DIAGNOSIS — I10 Essential (primary) hypertension: Secondary | ICD-10-CM

## 2013-08-31 DIAGNOSIS — I2510 Atherosclerotic heart disease of native coronary artery without angina pectoris: Secondary | ICD-10-CM

## 2013-08-31 DIAGNOSIS — E785 Hyperlipidemia, unspecified: Secondary | ICD-10-CM

## 2013-08-31 NOTE — Progress Notes (Signed)
Harry Morgan Date of Birth: 1928/02/15 Medical Record #[id number]  History of Present Illness: Harry Morgan is seen today for a followup visit. He has known CAD with prior CABG in February 2010. Other issues include HTN and HLD. He is limited by his arthritis and Parkinson's. His last Myoview study in October 2013 was normal. On followup today he is doing very well. According to his home blood pressure monitor his blood pressure readings have been good and he even will take only half a dose of losartan HCT if his blood pressures running low. He denies any chest pain or shortness of breath.  Current Outpatient Prescriptions on File Prior to Visit  Medication Sig Dispense Refill  . ALPRAZolam (XANAX) 0.5 MG tablet Take 0.5 mg by mouth as directed.       Marland Kitchen aspirin 325 MG tablet Take 325 mg by mouth at bedtime.       . carbidopa-levodopa (SINEMET) 25-100 MG per tablet Take 1-2 tablets by mouth as directed. Take 1 tablet in the morning, 1 tablets at lunch, 1 at 4pm and 1 tablet at night.      . losartan-hydrochlorothiazide (HYZAAR) 100-25 MG per tablet Take 1 tablet by mouth daily.        . polyethylene glycol powder (GLYCOLAX/MIRALAX) powder       . Psyllium (METAMUCIL PO) Take 5 mLs by mouth at bedtime.      . rosuvastatin (CRESTOR) 20 MG tablet Take 20 mg by mouth at bedtime.        No current facility-administered medications on file prior to visit.    No Known Allergies  Past Medical History  Diagnosis Date  . Coronary artery disease 2010    Status post CABG. This included an LIMA graft to the LAD, saphenous vein graft to the diagonal,  saphenous vein graft to the first and second obtuse marginal vessels, saphenous vein graft to the distal right coronary  . Renal calculi   . Hypertension   . Dyslipidemia   . Atrial fibrillation     Postoperative  . Arthritis of spine   . FHx: coronary artery disease   . Parkinson disease   . CHF (congestive heart failure)     Past  Surgical History  Procedure Laterality Date  . Coronary artery bypass graft  11/2008  . Hernia repair      Status post x3  . Knee surgery      Status post arthroscopic    History  Smoking status  . Former Smoker  Smokeless tobacco  . Not on file    History  Alcohol Use No    History reviewed. No pertinent family history.  Review of Systems: The review of systems is per the HPI.  All other systems were reviewed and are negative.  Physical Exam: BP 150/90  Pulse 68  Ht 6' (1.829 m)  Wt 214 lb (97.07 kg)  BMI 29.02 kg/m2 Patient is very pleasant and in no acute distress. He has a resting tremor. Skin is warm and dry. Color is normal.  HEENT is unremarkable. Normocephalic/atraumatic. PERRL. Sclera are nonicteric. Neck is supple. No masses. No JVD. Lungs are clear. Cardiac exam shows a regular rate and rhythm. Abdomen is soft. Extremities are without edema. Neurologic exam reveals a tremor. Gait is unsteady. He is using a cane. ROM appears intact. No gross neurologic deficits noted.   LABORATORY DATA: ECG today demonstrates normal sinus rhythm with a rate of 68 beats per minute. It is normal.  Assessment / Plan:  1.  CAD - prior CABG, normal Myoview study October 2013. Continue current medical therapy.  2. HTN - blood pressure under fair control.  3. Parkinson's disease  4. Hyperlipidemia-on Crestor.

## 2013-08-31 NOTE — Patient Instructions (Signed)
Continue your current therapy  I will see you in 6 months.   

## 2013-08-31 NOTE — Addendum Note (Signed)
Addended by: Johnston Ebbs on: 08/31/2013 02:10 PM   Modules accepted: Orders

## 2014-02-27 ENCOUNTER — Encounter: Payer: Self-pay | Admitting: Cardiology

## 2014-02-27 ENCOUNTER — Ambulatory Visit (INDEPENDENT_AMBULATORY_CARE_PROVIDER_SITE_OTHER): Payer: Medicare Other | Admitting: Cardiology

## 2014-02-27 VITALS — BP 149/99 | HR 79 | Ht 72.0 in | Wt 210.8 lb

## 2014-02-27 DIAGNOSIS — I2510 Atherosclerotic heart disease of native coronary artery without angina pectoris: Secondary | ICD-10-CM

## 2014-02-27 DIAGNOSIS — I10 Essential (primary) hypertension: Secondary | ICD-10-CM

## 2014-02-27 DIAGNOSIS — E785 Hyperlipidemia, unspecified: Secondary | ICD-10-CM

## 2014-02-27 MED ORDER — LOSARTAN POTASSIUM-HCTZ 50-12.5 MG PO TABS: 1.0000 | ORAL_TABLET | Freq: Every day | ORAL | Status: DC

## 2014-02-27 NOTE — Patient Instructions (Signed)
Continue your current therapy  I will see you in 6 months.   

## 2014-02-27 NOTE — Progress Notes (Signed)
Mingo Amberhomas L Wollman Date of Birth: 07-15-1928 Medical Record #[id number]  History of Present Illness: [name] is seen today for a followup visit. He has known CAD with prior CABG in February 2010. Other issues include HTN and HLD. He is limited by his arthritis and Parkinson's. His last Myoview study in October 2013 was normal. On followup today he is doing very well. According to his home blood pressure monitor his blood pressure readings have been very good. Ranges from 96-129 systolic. He is limited in his walking by arthritis in his knees. No chest pain or SOB.  Current Outpatient Prescriptions on File Prior to Visit  Medication Sig Dispense Refill  . ALPRAZolam (XANAX) 0.5 MG tablet Take 0.5 mg by mouth as directed.       Marland Kitchen. aspirin 325 MG tablet Take 325 mg by mouth at bedtime.       . carbidopa-levodopa (SINEMET) 25-100 MG per tablet Take 1-2 tablets by mouth as directed. Take 1 tablet in the morning, 1 tablets at lunch, 1 at 4pm and 1 tablet at night.      . nebivolol (BYSTOLIC) 10 MG tablet Take 10 mg by mouth daily.      . polyethylene glycol powder (GLYCOLAX/MIRALAX) powder       . Psyllium (METAMUCIL PO) Take 5 mLs by mouth at bedtime.      . rosuvastatin (CRESTOR) 20 MG tablet Take 20 mg by mouth at bedtime.        No current facility-administered medications on file prior to visit.    No Known Allergies  Past Medical History  Diagnosis Date  . Coronary artery disease 2010    Status post CABG. This included an LIMA graft to the LAD, saphenous vein graft to the diagonal,  saphenous vein graft to the first and second obtuse marginal vessels, saphenous vein graft to the distal right coronary  . Renal calculi   . Hypertension   . Dyslipidemia   . Atrial fibrillation     Postoperative  . Arthritis of spine   . FHx: coronary artery disease   . Parkinson disease   . CHF (congestive heart failure)     Past Surgical History  Procedure Laterality Date  . Coronary artery  bypass graft  11/2008  . Hernia repair      Status post x3  . Knee surgery      Status post arthroscopic    History  Smoking status  . Former Smoker  Smokeless tobacco  . Not on file    History  Alcohol Use No    History reviewed. No pertinent family history.  Review of Systems: The review of systems is per the HPI.  All other systems were reviewed and are negative.  Physical Exam: BP 149/99  Pulse 79  Ht 6' (1.829 m)  Wt 210 lb 12.8 oz (95.618 kg)  BMI 28.58 kg/m2 Patient is very pleasant and in no acute distress. He has a resting tremor R>L. Skin is warm and dry. Color is normal.  HEENT is unremarkable. Normocephalic/atraumatic. PERRL. Sclera are nonicteric. Neck is supple. No masses. No JVD. Lungs are clear. Cardiac exam shows a regular rate and rhythm. Abdomen is soft. Extremities are without edema. Neurologic exam reveals a tremor. Gait is unsteady. He is using a cane. ROM appears intact. No gross neurologic deficits noted.   LABORATORY DATA:   Assessment / Plan:  1.  CAD - prior CABG, normal Myoview study October 2013. Continue current medical therapy.  2.  HTN - blood pressure under good control based on home readings.  3. Parkinson's disease  4. Hyperlipidemia-on Crestor.  I will follow up in 6 months.

## 2014-08-30 ENCOUNTER — Encounter: Payer: Self-pay | Admitting: Cardiology

## 2014-08-30 ENCOUNTER — Ambulatory Visit (INDEPENDENT_AMBULATORY_CARE_PROVIDER_SITE_OTHER): Payer: Medicare Other | Admitting: Cardiology

## 2014-08-30 VITALS — BP 126/78 | HR 81 | Ht 72.0 in | Wt 210.2 lb

## 2014-08-30 DIAGNOSIS — I25729 Atherosclerosis of autologous artery coronary artery bypass graft(s) with unspecified angina pectoris: Secondary | ICD-10-CM

## 2014-08-30 DIAGNOSIS — I2510 Atherosclerotic heart disease of native coronary artery without angina pectoris: Secondary | ICD-10-CM

## 2014-08-30 DIAGNOSIS — E785 Hyperlipidemia, unspecified: Secondary | ICD-10-CM

## 2014-08-30 DIAGNOSIS — I10 Essential (primary) hypertension: Secondary | ICD-10-CM

## 2014-08-30 MED ORDER — NEBIVOLOL HCL 5 MG PO TABS: 5.0000 mg | ORAL_TABLET | Freq: Every day | ORAL | Status: DC

## 2014-08-30 NOTE — Patient Instructions (Addendum)
Medication samples have been provided to the patient. Drug name: Crestor 20 mg Qty: 35 tab LOT: [id number]  Exp.Date: 01/2017  Neta Ehlersruitt, Chelsi Warr M 9:09 AM 08/30/2014   We will reduce Bystolic to 5 mg daily  Continue your other therapy  I will see you in 6 months.

## 2014-08-30 NOTE — Progress Notes (Signed)
Harry Morgan Date of Birth: December 08, 1927 Medical Record #[id number]  History of Present Illness: Harry Morgan is seen today for follow up of CAD. He has known CAD with prior CABG in February 2010. Other issues include HTN and HLD. He is limited by his arthritis and Parkinson's. His last Myoview study in October 2013 was normal. On followup today he is doing very well. He is limited in his walking by arthritis in his knees. No chest pain or SOB. He wife states he doesn't like to go out a lot because of his tremors.   Current Outpatient Prescriptions on File Prior to Visit  Medication Sig Dispense Refill  . ALPRAZolam (XANAX) 0.5 MG tablet Take 0.5 mg by mouth as directed.     Marland Kitchen. aspirin 325 MG tablet Take 325 mg by mouth at bedtime.     . carbidopa-levodopa (SINEMET) 25-100 MG per tablet Take 1-2 tablets by mouth as directed. Take 1 tablet in the morning, 1 tablets at lunch, 1 at 4pm and 1 tablet at night.    . Cholecalciferol (VITAMIN D PO) Take by mouth every morning.    Marland Kitchen. losartan-hydrochlorothiazide (HYZAAR) 50-12.5 MG per tablet Take 1 tablet by mouth daily.    . polyethylene glycol powder (GLYCOLAX/MIRALAX) powder     . Psyllium (METAMUCIL PO) Take 5 mLs by mouth at bedtime.    . rosuvastatin (CRESTOR) 20 MG tablet Take 20 mg by mouth at bedtime.      No current facility-administered medications on file prior to visit.    No Known Allergies  Past Medical History  Diagnosis Date  . Coronary artery disease 2010    Status post CABG. This included an LIMA graft to the LAD, saphenous vein graft to the diagonal,  saphenous vein graft to the first and second obtuse marginal vessels, saphenous vein graft to the distal right coronary  . Renal calculi   . Hypertension   . Dyslipidemia   . Atrial fibrillation     Postoperative  . Arthritis of spine   . FHx: coronary artery disease   . Parkinson disease   . CHF (congestive heart failure)     Past Surgical History  Procedure  Laterality Date  . Coronary artery bypass graft  11/2008  . Hernia repair      Status post x3  . Knee surgery      Status post arthroscopic    History  Smoking status  . Former Smoker  Smokeless tobacco  . Not on file    History  Alcohol Use No    History reviewed. No pertinent family history.  Review of Systems: The review of systems is per the HPI.  He does have chronic frequent urination. He is followed by urology. All other systems were reviewed and are negative.  Physical Exam: BP 126/78 mmHg  Pulse 81  Ht 6' (1.829 m)  Wt 210 lb 3.2 oz (95.346 kg)  BMI 28.50 kg/m2 Patient is very pleasant and in no acute distress. He has a resting tremor R>L. Skin is warm and dry. Color is normal.  HEENT is unremarkable. Normocephalic/atraumatic. PERRL. Sclera are nonicteric. Neck is supple. No masses. No JVD. Lungs are clear. Cardiac exam shows a regular rate and rhythm. Normal S1-2. No gallop or murmur. Abdomen is soft. Extremities are without edema. Neurologic exam reveals a tremor. Gait is unsteady. He is using a cane. ROM appears intact. No gross neurologic deficits noted.   LABORATORY DATA: Ecg today: NSR, LAD, LVH by voltage.  I have personally reviewed and interpreted this study.   Assessment / Plan:  1.  CAD - prior CABG, normal Myoview study October 2013. Continue current medical therapy.  2. HTN - blood pressure under good control.  3. Parkinson's disease  4. Hyperlipidemia-on Crestor.  I will follow up in 6 months.

## 2014-11-18 ENCOUNTER — Encounter (HOSPITAL_COMMUNITY): Payer: Self-pay | Admitting: *Deleted

## 2014-11-18 ENCOUNTER — Emergency Department (HOSPITAL_COMMUNITY): Payer: Medicare Other

## 2014-11-18 ENCOUNTER — Observation Stay (HOSPITAL_COMMUNITY)
Admission: EM | Admit: 2014-11-18 | Discharge: 2014-11-20 | Disposition: A | Discharge status: Home / Self Care | Payer: Medicare Other | Attending: Internal Medicine | Admitting: Internal Medicine

## 2014-11-18 DIAGNOSIS — M479 Spondylosis, unspecified: Secondary | ICD-10-CM | POA: Diagnosis not present

## 2014-11-18 DIAGNOSIS — I10 Essential (primary) hypertension: Secondary | ICD-10-CM | POA: Diagnosis not present

## 2014-11-18 DIAGNOSIS — E785 Hyperlipidemia, unspecified: Secondary | ICD-10-CM

## 2014-11-18 DIAGNOSIS — Z7982 Long term (current) use of aspirin: Secondary | ICD-10-CM | POA: Insufficient documentation

## 2014-11-18 DIAGNOSIS — I25729 Atherosclerosis of autologous artery coronary artery bypass graft(s) with unspecified angina pectoris: Secondary | ICD-10-CM

## 2014-11-18 DIAGNOSIS — I2510 Atherosclerotic heart disease of native coronary artery without angina pectoris: Secondary | ICD-10-CM | POA: Diagnosis present

## 2014-11-18 DIAGNOSIS — Z87442 Personal history of urinary calculi: Secondary | ICD-10-CM | POA: Insufficient documentation

## 2014-11-18 DIAGNOSIS — R55 Syncope and collapse: Principal | ICD-10-CM

## 2014-11-18 DIAGNOSIS — G20 Parkinson's disease: Secondary | ICD-10-CM

## 2014-11-18 DIAGNOSIS — I509 Heart failure, unspecified: Secondary | ICD-10-CM | POA: Diagnosis not present

## 2014-11-18 DIAGNOSIS — R42 Dizziness and giddiness: Secondary | ICD-10-CM | POA: Insufficient documentation

## 2014-11-18 DIAGNOSIS — Z79899 Other long term (current) drug therapy: Secondary | ICD-10-CM | POA: Insufficient documentation

## 2014-11-18 LAB — CBC
HEMATOCRIT: 45.3 % (ref 39.0–52.0)
HEMOGLOBIN: 15.2 g/dL (ref 13.0–17.0)
MCH: 31.7 pg (ref 26.0–34.0)
MCHC: 33.6 g/dL (ref 30.0–36.0)
MCV: 94.6 fL (ref 78.0–100.0)
PLATELETS: 155 10*3/uL (ref 150–400)
RBC: 4.79 MIL/uL (ref 4.22–5.81)
RDW: 12.5 % (ref 11.5–15.5)
WBC: 5.6 10*3/uL (ref 4.0–10.5)

## 2014-11-18 LAB — BASIC METABOLIC PANEL
Anion gap: 11 (ref 5–15)
BUN: 17 mg/dL (ref 6–23)
CHLORIDE: 103 mmol/L (ref 96–112)
CO2: 27 mmol/L (ref 19–32)
Calcium: 9.4 mg/dL (ref 8.4–10.5)
Creatinine, Ser: 1.02 mg/dL (ref 0.50–1.35)
GFR calc non Af Amer: 65 mL/min — ABNORMAL LOW (ref >90)
GFR, EST AFRICAN AMERICAN: 76 mL/min — AB (ref >90)
GLUCOSE: 118 mg/dL — AB (ref 70–99)
Potassium: 3.4 mmol/L — ABNORMAL LOW (ref 3.5–5.1)
SODIUM: 141 mmol/L (ref 135–145)

## 2014-11-18 LAB — I-STAT TROPONIN, ED: TROPONIN I, POC: 0 ng/mL (ref 0.00–0.08)

## 2014-11-18 MED ORDER — ASPIRIN 325 MG PO TABS
325.0000 mg | ORAL_TABLET | Freq: Every day | ORAL | Status: DC
  Administered 2014-11-19 (×2): 325 mg via ORAL
  Filled 2014-11-18 (×3): qty 1

## 2014-11-18 MED ORDER — CARBIDOPA-LEVODOPA 25-100 MG PO TABS
1.0000 | ORAL_TABLET | Freq: Once | ORAL | Status: AC
  Administered 2014-11-18: 1 via ORAL
  Filled 2014-11-18: qty 1

## 2014-11-18 MED ORDER — VITAMIN D3 25 MCG (1000 UNIT) PO TABS
1000.0000 [IU] | ORAL_TABLET | Freq: Every day | ORAL | Status: DC
  Administered 2014-11-19 – 2014-11-20 (×2): 1000 [IU] via ORAL
  Filled 2014-11-18 (×2): qty 1

## 2014-11-18 MED ORDER — ALPRAZOLAM 0.5 MG PO TABS: 0.5000 mg | ORAL_TABLET | Freq: Two times a day (BID) | ORAL | Status: DC | PRN

## 2014-11-18 MED ORDER — ACETAMINOPHEN 650 MG RE SUPP: 650.0000 mg | Freq: Four times a day (QID) | RECTAL | Status: DC | PRN

## 2014-11-18 MED ORDER — OXYCODONE HCL 5 MG PO TABS: 5.0000 mg | ORAL_TABLET | ORAL | Status: DC | PRN

## 2014-11-18 MED ORDER — ONDANSETRON HCL 4 MG PO TABS: 4.0000 mg | ORAL_TABLET | Freq: Four times a day (QID) | ORAL | Status: DC | PRN

## 2014-11-18 MED ORDER — CARBIDOPA-LEVODOPA 25-100 MG PO TABS
1.0000 | ORAL_TABLET | Freq: Three times a day (TID) | ORAL | Status: DC
  Administered 2014-11-19 – 2014-11-20 (×6): 1 via ORAL
  Filled 2014-11-18 (×9): qty 1

## 2014-11-18 MED ORDER — ALUM & MAG HYDROXIDE-SIMETH 200-200-20 MG/5ML PO SUSP: 30.0000 mL | Freq: Four times a day (QID) | ORAL | Status: DC | PRN

## 2014-11-18 MED ORDER — PSYLLIUM 95 % PO PACK
1.0000 | PACK | Freq: Every day | ORAL | Status: DC
  Administered 2014-11-19 – 2014-11-20 (×2): 1 via ORAL
  Filled 2014-11-18 (×2): qty 1

## 2014-11-18 MED ORDER — ROPINIROLE HCL 0.25 MG PO TABS
0.2500 mg | ORAL_TABLET | Freq: Every day | ORAL | Status: DC
  Administered 2014-11-19: 0.25 mg via ORAL
  Filled 2014-11-18 (×2): qty 1

## 2014-11-18 MED ORDER — HYDROMORPHONE HCL 1 MG/ML IJ SOLN: 0.5000 mg | INTRAMUSCULAR | Status: DC | PRN

## 2014-11-18 MED ORDER — ROSUVASTATIN CALCIUM 20 MG PO TABS
20.0000 mg | ORAL_TABLET | Freq: Every day | ORAL | Status: DC
  Administered 2014-11-19 (×2): 20 mg via ORAL
  Filled 2014-11-18 (×3): qty 1

## 2014-11-18 MED ORDER — ACETAMINOPHEN 325 MG PO TABS: 650.0000 mg | ORAL_TABLET | Freq: Four times a day (QID) | ORAL | Status: DC | PRN

## 2014-11-18 MED ORDER — NEBIVOLOL HCL 10 MG PO TABS
10.0000 mg | ORAL_TABLET | Freq: Every day | ORAL | Status: DC
  Administered 2014-11-19 – 2014-11-20 (×2): 10 mg via ORAL
  Filled 2014-11-18 (×2): qty 1

## 2014-11-18 MED ORDER — ONDANSETRON HCL 4 MG/2ML IJ SOLN: 4.0000 mg | Freq: Four times a day (QID) | INTRAMUSCULAR | Status: DC | PRN

## 2014-11-18 MED ORDER — SODIUM CHLORIDE 0.9 % IJ SOLN
3.0000 mL | Freq: Two times a day (BID) | INTRAMUSCULAR | Status: DC
  Administered 2014-11-19 – 2014-11-20 (×4): 3 mL via INTRAVENOUS

## 2014-11-18 MED ORDER — POLYETHYLENE GLYCOL 3350 17 G PO PACK
17.0000 g | PACK | Freq: Every day | ORAL | Status: DC | PRN
  Filled 2014-11-18: qty 1

## 2014-11-18 NOTE — H&P (Signed)
[facility] Admission History and Physical       Harry Morgan ZOX:[id number] DOB: 1927/10/27 DOA: 11/18/2014  Referring physician: EDP PCP: Harry Canter, MD  Specialists:   Chief Complaint: Passed Out   HPI: Harry Morgan is a 81 y.o. male with a history of Parkinson's disease, CAD S/P CABG x 5 vessels, CHF, HTN and Hyperlipidemia who presents to the ED after suffering a syncopal episode while eating dinner witnessed by his wife.   She saw him slump over, and he was unresponsive for 30 seconds.  EMS was called.   He denies any prodromal symptoms . He was found to have hypotension when EMS arrived.      Review of Systems:   Constitutional: No Weight Loss, No Weight Gain, Night Sweats, Fevers, Chills, Dizziness, Fatigue, or Generalized Weakness HEENT: No Headaches, Difficulty Swallowing,Tooth/Dental Problems,Sore Throat,  No Sneezing, Rhinitis, Ear Ache, Nasal Congestion, or Post Nasal Drip,  Cardio-vascular:  No Chest pain, Orthopnea, PND, Edema in Lower Extremities, Anasarca, Dizziness, Palpitations  Resp: No Dyspnea, No DOE, No Productive Cough, No Non-Productive Cough, No Hemoptysis, No Wheezing.    GI: No Heartburn, Indigestion, Abdominal Pain, Nausea, Vomiting, Diarrhea, Hematemesis, Hematochezia, Melena, Change in Bowel Habits,  Loss of Appetite  GU: No Dysuria, Change in Color of Urine, No Urgency or Frequency, No Flank pain.  Musculoskeletal: No Joint Pain or Swelling, No Decreased Range of Motion, No Back Pain.  Neurologic:   +Syncope, No Seizures, Muscle Weakness, Paresthesia, Vision Disturbance or Loss, No Diplopia, No Vertigo, No Difficulty Walking,  Skin: No Rash or Lesions. Psych: No Change in Mood or Affect, No Depression or Anxiety, No Memory loss, No Confusion, or Hallucinations   Past Medical History  Diagnosis Date  . Coronary artery disease 2010    Status post CABG. This included an LIMA graft to the LAD, saphenous vein graft to the diagonal,   saphenous vein graft to the first and second obtuse marginal vessels, saphenous vein graft to the distal right coronary  . Renal calculi   . Hypertension   . Dyslipidemia   . Atrial fibrillation     Postoperative  . Arthritis of spine   . FHx: coronary artery disease   . Parkinson disease   . CHF (congestive heart failure)      Past Surgical History  Procedure Laterality Date  . Coronary artery bypass graft  11/2008  . Hernia repair      Status post x3  . Knee surgery      Status post arthroscopic      Prior to Admission medications   Medication Sig Start Date End Date Taking? Authorizing Provider  Acetaminophen (TYLENOL ARTHRITIS PAIN PO) Take 1 tablet by mouth daily as needed. For pain   Yes Historical Provider, MD  ALPRAZolam Prudy Feeler) 0.5 MG tablet Take 0.5 mg by mouth 2 (two) times daily as needed for anxiety.  08/16/12  Yes Historical Provider, MD  aspirin 325 MG tablet Take 325 mg by mouth at bedtime.    Yes Historical Provider, MD  carbidopa-levodopa (SINEMET) 25-100 MG per tablet Take 1-2 tablets by mouth See admin instructions. Take 1 tablet in the morning, 1 tablets at lunch, 1 at 4pm and 1 tablet at night.   Yes Historical Provider, MD  Cholecalciferol (VITAMIN D PO) Take by mouth every morning.   Yes Historical Provider, MD  nebivolol (BYSTOLIC) 10 MG tablet Take 10 mg by mouth daily.   Yes Historical Provider, MD  polyethylene glycol powder (GLYCOLAX/MIRALAX)  powder Take 0.5 Containers by mouth daily as needed for mild constipation.  08/24/12  Yes Historical Provider, MD  Psyllium (METAMUCIL PO) Take 5 mLs by mouth at bedtime.   Yes Historical Provider, MD  rOPINIRole (REQUIP) 0.25 MG tablet Take 0.25 mg by mouth daily.   Yes Historical Provider, MD  rosuvastatin (CRESTOR) 20 MG tablet Take 20 mg by mouth at bedtime.    Yes Historical Provider, MD  nebivolol (BYSTOLIC) 5 MG tablet Take 1 tablet (5 mg total) by mouth daily. Patient not taking: Reported on 11/18/2014  08/30/14   Peter M SwazilandJordan, MD     No Known Allergies  Social History:  reports that he has quit smoking. He does not have any smokeless tobacco history on file. He reports that he does not drink alcohol or use illicit drugs.    No family history on file.     Physical Exam:  GEN:  Pleasant Elderly Well Nourished and Well Developed  81 y.o. Caucasian male examined  and in no acute distress; cooperative with exam Filed Vitals:   11/18/14 1915 11/18/14 2100 11/18/14 2130 11/18/14 2200  BP: 204/74 147/98 144/76 157/99  Pulse: 91 90 86 90  Temp:      TempSrc:      Resp: 20 14 17 17   SpO2: 97% 96% 96% 93%   Blood pressure 157/99, pulse 90, temperature 97.9 F (36.6 C), temperature source Oral, resp. rate 17, SpO2 93 %. PSYCH: He is alert and oriented x4; does not appear anxious does not appear depressed; affect is normal HEENT: Normocephalic and Atraumatic, Mucous membranes pink; PERRLA; EOM intact; Fundi:  Benign;  No scleral icterus, Nares: Patent, Oropharynx: Clear, Fair Dentition,    Neck:  FROM, No Cervical Lymphadenopathy nor Thyromegaly or Carotid Bruit; No JVD; Breasts:: Not examined CHEST WALL: No tenderness CHEST: Normal respiration, clear to auscultation bilaterally HEART: Regular rate and rhythm; no murmurs rubs or gallops BACK: No kyphosis or scoliosis; No CVA tenderness ABDOMEN: Positive Bowel Sounds, Soft Non-Tender; No Masses, No Organomegaly.   Rectal Exam: Not done EXTREMITIES: No Cyanosis, Clubbing, or Edema; No Ulcerations. Genitalia: not examined PULSES: 2+ and symmetric SKIN: Normal hydration no rash or ulceration CNS:  Alert and Oriented x 4, No Focal Deficits except HOH Vascular: pulses palpable throughout    Labs on Admission:  Basic Metabolic Panel:  Recent Labs Lab 11/18/14 1844  NA 141  K 3.4*  CL 103  CO2 27  GLUCOSE 118*  BUN 17  CREATININE 1.02  CALCIUM 9.4   Liver Function Tests: No results for input(s): AST, ALT, ALKPHOS, BILITOT,  PROT, ALBUMIN in the last 168 hours. No results for input(s): LIPASE, AMYLASE in the last 168 hours. No results for input(s): AMMONIA in the last 168 hours. CBC:  Recent Labs Lab 11/18/14 1844  WBC 5.6  HGB 15.2  HCT 45.3  MCV 94.6  PLT 155   Cardiac Enzymes: No results for input(s): CKTOTAL, CKMB, CKMBINDEX, TROPONINI in the last 168 hours.  BNP (last 3 results) No results for input(s): BNP in the last 8760 hours.  ProBNP (last 3 results) No results for input(s): PROBNP in the last 8760 hours.  CBG: No results for input(s): GLUCAP in the last 168 hours.  Radiological Exams on Admission: Dg Chest 2 View  11/18/2014   CLINICAL DATA:  Syncope, history of coronary artery disease, hypertension, CHF  EXAM: CHEST  2 VIEW  COMPARISON:  07/23/2012  FINDINGS: The heart size and mediastinal contours are within  normal limits. Both lungs are clear. The visualized skeletal structures are unremarkable. Evidence of CABG.  IMPRESSION: No active cardiopulmonary disease.   Electronically Signed   By: Christiana Pellant M.D.   On: 11/18/2014 20:00     EKG: Independently reviewed. Sinus Rhythm rate 89 Diffuse Artifact     Assessment/Plan:  81 y.o. male with  Principal Problem:   1.    Syncope   Telemetry Monitoring   Syncope Workup   Check Orthostatics     Active Problems:  2.    Coronary artery disease- stable     3.   Hypertension   Hold Bystolic for SBP < 100 or HR < 60    4.   Dyslipidemia   Continue Rovustatin Rx     5.   CHF (congestive heart failure)   Monitor for Signs of Decompensation     6.   DVT Prophylaxis    Lovenox    Code Status:    FULL CODE Family Communication:   Wife and Son at Bedside Disposition Plan:   Observation Telemetry      Time spent:  60 Minutes  Ron Parker [facility] Pager [phone]   If 7AM -7PM Please Contact the Day Rounding Team MD for [facility]  If 7PM-7AM, Please Contact Night-Floor Coverage    [email or web address] Password [email or web address] 11/18/2014, 10:16 PM     ADDENDUM:   Patient was seen and examined on 11/18/2014

## 2014-11-18 NOTE — ED Provider Notes (Signed)
CSN: [id number]     Arrival date & time 11/18/14  1824 History   First MD Initiated Contact with Patient 11/18/14 1824     Chief Complaint  Patient presents with  . Loss of Consciousness     (Consider location/radiation/quality/duration/timing/severity/associated sxs/prior Treatment) Patient is a 81 y.o. male presenting with syncope. The history is provided by the patient.  Loss of Consciousness Episode history:  Single Most recent episode:  Today Duration:  30 seconds Timing:  Constant Progression:  Unchanged Chronicity:  New Context comment:  Eating Witnessed: yes   Relieved by:  Nothing Associated symptoms: dizziness   Associated symptoms: no anxiety, no chest pain, no fever, no shortness of breath and no vomiting     Past Medical History  Diagnosis Date  . Coronary artery disease 2010    Status post CABG. This included an LIMA graft to the LAD, saphenous vein graft to the diagonal,  saphenous vein graft to the first and second obtuse marginal vessels, saphenous vein graft to the distal right coronary  . Renal calculi   . Hypertension   . Dyslipidemia   . Atrial fibrillation     Postoperative  . Arthritis of spine   . FHx: coronary artery disease   . Parkinson disease   . CHF (congestive heart failure)    Past Surgical History  Procedure Laterality Date  . Coronary artery bypass graft  11/2008  . Hernia repair      Status post x3  . Knee surgery      Status post arthroscopic   No family history on file. History  Substance Use Topics  . Smoking status: Former Games developermoker  . Smokeless tobacco: Not on file  . Alcohol Use: No    Review of Systems  Constitutional: Negative for fever.  Respiratory: Negative for cough and shortness of breath.   Cardiovascular: Positive for syncope. Negative for chest pain.  Gastrointestinal: Negative for vomiting and abdominal pain.  Neurological: Positive for dizziness.  All other systems reviewed and are  negative.     Allergies  Review of patient's allergies indicates no known allergies.  Home Medications   Prior to Admission medications   Medication Sig Start Date End Date Taking? Authorizing Provider  Acetaminophen (TYLENOL ARTHRITIS PAIN PO) Take 1 tablet by mouth daily as needed. For pain   Yes Historical Provider, MD  ALPRAZolam Prudy Feeler(XANAX) 0.5 MG tablet Take 0.5 mg by mouth 2 (two) times daily as needed for anxiety.  08/16/12  Yes Historical Provider, MD  aspirin 325 MG tablet Take 325 mg by mouth at bedtime.    Yes Historical Provider, MD  carbidopa-levodopa (SINEMET) 25-100 MG per tablet Take 1-2 tablets by mouth See admin instructions. Take 1 tablet in the morning, 1 tablets at lunch, 1 at 4pm and 1 tablet at night.   Yes Historical Provider, MD  Cholecalciferol (VITAMIN D PO) Take by mouth every morning.   Yes Historical Provider, MD  nebivolol (BYSTOLIC) 10 MG tablet Take 10 mg by mouth daily.   Yes Historical Provider, MD  polyethylene glycol powder (GLYCOLAX/MIRALAX) powder Take 0.5 Containers by mouth daily as needed for mild constipation.  08/24/12  Yes Historical Provider, MD  Psyllium (METAMUCIL PO) Take 5 mLs by mouth at bedtime.   Yes Historical Provider, MD  rOPINIRole (REQUIP) 0.25 MG tablet Take 0.25 mg by mouth daily.   Yes Historical Provider, MD  rosuvastatin (CRESTOR) 20 MG tablet Take 20 mg by mouth at bedtime.    Yes Historical Provider,  MD  nebivolol (BYSTOLIC) 5 MG tablet Take 1 tablet (5 mg total) by mouth daily. Patient not taking: Reported on 11/18/2014 08/30/14   Peter M [place], MD   BP 189/93 mmHg  Pulse 91  Temp(Src) 97.9 F (36.6 C) (Oral)  Resp 18  SpO2 100% Physical Exam  Constitutional: He is oriented to person, place, and time. He appears well-developed and well-nourished. No distress.  HENT:  Head: Normocephalic and atraumatic.  Mouth/Throat: No oropharyngeal exudate.  Eyes: EOM are normal. Pupils are equal, round, and reactive to light.  Neck:  Normal range of motion. Neck supple.  Cardiovascular: Normal rate and regular rhythm.  Exam reveals no friction rub.   No murmur heard. Pulmonary/Chest: Effort normal and breath sounds normal. No respiratory distress. He has no wheezes. He has no rales.  Abdominal: He exhibits no distension. There is no tenderness. There is no rebound.  Musculoskeletal: Normal range of motion. He exhibits no edema.  Neurological: He is alert and oriented to person, place, and time.  Skin: He is not diaphoretic.    ED Course  Procedures (including critical care time) Labs Review Labs Reviewed  CBC  BASIC METABOLIC PANEL  I-STAT TROPOININ, ED    Imaging Review Dg Chest 2 View  11/18/2014   CLINICAL DATA:  Syncope, history of coronary artery disease, hypertension, CHF  EXAM: CHEST  2 VIEW  COMPARISON:  07/23/2012  FINDINGS: The heart size and mediastinal contours are within normal limits. Both lungs are clear. The visualized skeletal structures are unremarkable. Evidence of CABG.  IMPRESSION: No active cardiopulmonary disease.   Electronically Signed   By: Christiana Pellant M.D.   On: 11/18/2014 20:00     EKG Interpretation   Date/Time:  Saturday November 18 2014 18:51:48 EST Ventricular Rate:  89 PR Interval:  154 QRS Duration: 109 QT Interval:  396 QTC Calculation: 482 R Axis:   86 Text Interpretation:  Sinus rhythm Borderline right axis deviation  Borderline repolarization abnormality Borderline prolonged QT interval  Artifact in lead(s) I III aVR aVL V2 and baseline wander in lead(s) II III  aVR aVF Artifact likely due to Parkinson's tremor Confirmed by Gwendolyn Grant  MD,  Hannah Strader ([id number]) on 11/18/2014 8:21:41 PM      MDM   Final diagnoses:  Syncope   56M with hx of Parkinson's presents with syncopal episode. Passed out while eating dinner, states, "felt swimmy-headed, not that bad." No missed meds. No prior CP, SOB. Doing well here, vitals stable. Neurologically intact, good strength, mild tremor,  mostly R sided tremor. Workup benign for syncope. Admitted.  Elwin Mocha, MD 11/19/14 [phone]

## 2014-11-18 NOTE — ED Notes (Signed)
Pt arrives via EMS from home. Pt has hx parkinsons, CABG, HTN. Pt was eating dinner and experienced an episode on syncope. No trauma occurred. Pts wife caught him at the dinner table. Pt wife states he was unconscious for 30 secs with no seizure or stroke symptoms. Pt has been c/o increased urinary frequency.

## 2014-11-19 ENCOUNTER — Observation Stay (HOSPITAL_COMMUNITY): Payer: Medicare Other

## 2014-11-19 DIAGNOSIS — G20A1 Parkinson's disease without dyskinesia, without mention of fluctuations: Secondary | ICD-10-CM | POA: Insufficient documentation

## 2014-11-19 DIAGNOSIS — G20 Parkinson's disease: Secondary | ICD-10-CM | POA: Insufficient documentation

## 2014-11-19 DIAGNOSIS — R55 Syncope and collapse: Secondary | ICD-10-CM | POA: Diagnosis not present

## 2014-11-19 LAB — BASIC METABOLIC PANEL
Anion gap: 6 (ref 5–15)
BUN: 15 mg/dL (ref 6–23)
CALCIUM: 9.1 mg/dL (ref 8.4–10.5)
CHLORIDE: 105 mmol/L (ref 96–112)
CO2: 30 mmol/L (ref 19–32)
Creatinine, Ser: 0.9 mg/dL (ref 0.50–1.35)
GFR calc Af Amer: 88 mL/min — ABNORMAL LOW (ref >90)
GFR, EST NON AFRICAN AMERICAN: 76 mL/min — AB (ref >90)
GLUCOSE: 102 mg/dL — AB (ref 70–99)
POTASSIUM: 3.4 mmol/L — AB (ref 3.5–5.1)
SODIUM: 141 mmol/L (ref 135–145)

## 2014-11-19 LAB — TROPONIN I

## 2014-11-19 LAB — CBC
HCT: 43.2 % (ref 39.0–52.0)
Hemoglobin: 14.6 g/dL (ref 13.0–17.0)
MCH: 31.8 pg (ref 26.0–34.0)
MCHC: 33.8 g/dL (ref 30.0–36.0)
MCV: 94.1 fL (ref 78.0–100.0)
Platelets: 150 10*3/uL (ref 150–400)
RBC: 4.59 MIL/uL (ref 4.22–5.81)
RDW: 12.7 % (ref 11.5–15.5)
WBC: 6.4 10*3/uL (ref 4.0–10.5)

## 2014-11-19 MED ORDER — ENOXAPARIN SODIUM 40 MG/0.4ML ~~LOC~~ SOLN
40.0000 mg | SUBCUTANEOUS | Status: DC
  Administered 2014-11-19 – 2014-11-20 (×2): 40 mg via SUBCUTANEOUS
  Filled 2014-11-19 (×2): qty 0.4

## 2014-11-19 MED ORDER — POTASSIUM CHLORIDE CRYS ER 20 MEQ PO TBCR
40.0000 meq | EXTENDED_RELEASE_TABLET | Freq: Once | ORAL | Status: AC
  Administered 2014-11-19: 40 meq via ORAL
  Filled 2014-11-19: qty 2

## 2014-11-19 NOTE — Progress Notes (Signed)
[facility] PROGRESS NOTE  Harry Morgan L Surratt [id number] DOB: 1927-11-06 DOA: 11/18/2014 PCP: Olivia CanterKELLY, WILLIAM, MD  Assessment/Plan: 1. Syncope 1. Seems to be neurocardiogenic in nature 2. Pt reported feeling "weird" and "dizzy" after starting Requip that was prescribed recently 3. Will order head CT 4. Follow 2d echo results - pending 5. Will hold Requip for now as syncope is listed as a possible side effect per literature search 6. May consider outpatient holter if studies negative 2. CAD 1. Stable 2. No chest pain or sob 3. HTN 1. BP stable and controlled 2. Cont home bystolic 4. HLD 1. Continue statin 5. Hx CHF 1. 2d echo pending 2. Pt clinically euvolemic 6. DVT prophylaxis 1. Lovenox subQ  Code Status: Full Family Communication: Pt in room, wife at bedside (indicate person spoken with, relationship, and if by phone, the number) Disposition Plan: pending   Consultants:  none  Procedures:  2d echo pending  Antibiotics:  none (indicate start date, and stop date if known)  HPI/Subjective: Feels better today. No acute events noted overnight  Objective: Filed Vitals:   11/19/14 0534 11/19/14 0634 11/19/14 1059 11/19/14 1453  BP: 143/96  119/84 122/76  Pulse: 88  73 75  Temp: 97.7 F (36.5 C)   97.4 F (36.3 C)  TempSrc: Oral   Oral  Resp: 18   20  Height:      Weight: 93.711 kg (206 lb 9.5 oz)     SpO2: 97% 95%  97%    Intake/Output Summary (Last 24 hours) at 11/19/14 1536 Last data filed at 11/19/14 1452  Gross per 24 hour  Intake    723 ml  Output    500 ml  Net    223 ml   Filed Weights   11/18/14 2303 11/19/14 0534  Weight: 93.713 kg (206 lb 9.6 oz) 93.711 kg (206 lb 9.5 oz)    Exam:   General:  Awake, in nad  Cardiovascular: regular, s1, s2  Respiratory: normal resp effort, no wheezing  Abdomen: soft,nondistended  Musculoskeletal: perfused, no clubbing   Data Reviewed: Basic Metabolic Panel:  Recent Labs Lab  11/18/14 1844 11/19/14 0600  NA 141 141  K 3.4* 3.4*  CL 103 105  CO2 27 30  GLUCOSE 118* 102*  BUN 17 15  CREATININE 1.02 0.90  CALCIUM 9.4 9.1   Liver Function Tests: No results for input(s): AST, ALT, ALKPHOS, BILITOT, PROT, ALBUMIN in the last 168 hours. No results for input(s): LIPASE, AMYLASE in the last 168 hours. No results for input(s): AMMONIA in the last 168 hours. CBC:  Recent Labs Lab 11/18/14 1844 11/19/14 0600  WBC 5.6 6.4  HGB 15.2 14.6  HCT 45.3 43.2  MCV 94.6 94.1  PLT 155 150   Cardiac Enzymes:  Recent Labs Lab 11/19/14 0855  TROPONINI <0.03   BNP (last 3 results) No results for input(s): BNP in the last 8760 hours.  ProBNP (last 3 results) No results for input(s): PROBNP in the last 8760 hours.  CBG: No results for input(s): GLUCAP in the last 168 hours.  No results found for this or any previous visit (from the past 240 hour(s)).   Studies: Dg Chest 2 View  11/18/2014   CLINICAL DATA:  Syncope, history of coronary artery disease, hypertension, CHF  EXAM: CHEST  2 VIEW  COMPARISON:  07/23/2012  FINDINGS: The heart size and mediastinal contours are within normal limits. Both lungs are clear. The visualized skeletal structures are unremarkable. Evidence of CABG.  IMPRESSION: No active cardiopulmonary disease.   Electronically Signed   By: Christiana Pellant M.D.   On: 11/18/2014 20:00    Scheduled Meds: . aspirin  325 mg Oral QHS  . carbidopa-levodopa  1 tablet Oral TID WC & HS  . cholecalciferol  1,000 Units Oral Daily  . enoxaparin (LOVENOX) injection  40 mg Subcutaneous Q24H  . nebivolol  10 mg Oral Daily  . psyllium  1 packet Oral Daily  . rosuvastatin  20 mg Oral QHS  . sodium chloride  3 mL Intravenous Q12H   Continuous Infusions:   Principal Problem:   Syncope Active Problems:   Coronary artery disease   Hypertension   Dyslipidemia   CHF (congestive heart failure)   Parkinson disease    Osha Errico K  [facility] Pager [phone]. If 7PM-7AM, please contact night-coverage at [email or web address], password [facility] 11/19/2014, 3:36 PM  LOS: 1 day

## 2014-11-19 NOTE — Progress Notes (Signed)
Utilization Review Completed.   Cataleyah Colborn, RN, BSN Nurse Case Manager  

## 2014-11-20 DIAGNOSIS — R55 Syncope and collapse: Secondary | ICD-10-CM

## 2014-11-20 LAB — BASIC METABOLIC PANEL
Anion gap: 4 — ABNORMAL LOW (ref 5–15)
BUN: 15 mg/dL (ref 6–23)
CO2: 32 mmol/L (ref 19–32)
CREATININE: 1.07 mg/dL (ref 0.50–1.35)
Calcium: 9.5 mg/dL (ref 8.4–10.5)
Chloride: 105 mmol/L (ref 96–112)
GFR calc Af Amer: 71 mL/min — ABNORMAL LOW (ref >90)
GFR calc non Af Amer: 62 mL/min — ABNORMAL LOW (ref >90)
GLUCOSE: 95 mg/dL (ref 70–99)
Potassium: 3.8 mmol/L (ref 3.5–5.1)
Sodium: 141 mmol/L (ref 135–145)

## 2014-11-20 NOTE — Progress Notes (Signed)
Discharge instructions given. Pt verbalized understanding and all questions were answered.  

## 2014-11-20 NOTE — Progress Notes (Signed)
Nutrition Brief Note  Patient identified on the Malnutrition Screening Tool (MST) Report  Wt Readings from Last 15 Encounters:  11/19/14 206 lb 9.5 oz (93.711 kg)  08/30/14 210 lb 3.2 oz (95.346 kg)  02/27/14 210 lb 12.8 oz (95.618 kg)  08/31/13 214 lb (97.07 kg)  03/03/13 215 lb 12.8 oz (97.886 kg)  08/27/12 216 lb (97.977 kg)  08/11/12 215 lb (97.523 kg)  08/04/12 214 lb (97.07 kg)  07/27/12 218 lb 6.4 oz (99.066 kg)  01/13/12 226 lb (102.513 kg)  07/16/11 229 lb 12.8 oz (104.237 kg)    Body mass index is 28.01 kg/(m^2). Patient meets criteria for Overweight based on current BMI. Echo being performed at bedside at time of visit. No evidence of significant weight loss per weight history.   Current diet order is Heart Healthy diet, patient is consuming approximately 100% of meals at this time. Labs and medications reviewed.   No nutrition interventions warranted at this time. If nutrition issues arise, please consult RD.   Ian Malkineanne Barnett RD, LDN Inpatient Clinical Dietitian Pager: [phone] After Hours Pager: [phone]'

## 2014-11-20 NOTE — Progress Notes (Signed)
UR completed 

## 2014-11-20 NOTE — Progress Notes (Signed)
  Echocardiogram 2D Echocardiogram has been performed.  [name], [name] 11/20/2014, 11:42 AM

## 2014-11-20 NOTE — Progress Notes (Signed)
The patient's VS were stable overnight and he did not have any complaints.

## 2014-11-20 NOTE — Discharge Summary (Signed)
Physician Discharge Summary  Harry Morgan:[id number] DOB: 06/27/1928 DOA: 11/18/2014  PCP: Olivia CanterKELLY, WILLIAM, MD  Admit date: 11/18/2014 Discharge date: 11/20/2014  Time spent: 25 minutes  Recommendations for Outpatient Follow-up:  1. Follow up with PCP in 1-2 weeks 2. Consider alternative regimen besides Requip  Discharge Diagnoses:  Principal Problem:   Syncope Active Problems:   Coronary artery disease   Hypertension   Dyslipidemia   CHF (congestive heart failure)   Parkinson disease   Discharge Condition: Stable  Diet recommendation: Heart healthy  Filed Weights   11/18/14 2303 11/19/14 0534  Weight: 93.713 kg (206 lb 9.6 oz) 93.711 kg (206 lb 9.5 oz)    History of present illness:  Please review h and p from 2/6 for details. Briefly, pt presented with syncopal episode while eating. Pt was recently started on Requip as an ouptiatient prior to episode. Pt was admitted for further work up.  Hospital Course:  1. Syncope 1. Seems to be neurocardiogenic in nature 2. Pt reported feeling "weird" and "dizzy" after starting Requip that was prescribed recently prior to symptom onset 3. Head CT unremarkable 4. 2d echo results - normal EF 60-65% with mildly dilated LA 5. Held Requip as syncope is listed as a possible side effect per literature search 6. May consider outpatient holter if studies negative and patient remains symptomatic 2. CAD 1. Stable 2. No chest pain or sob 3. HTN 1. BP stable and controlled 2. Cont home bystolic 4. HLD 1. Continue statin 5. Hx CHF 1. 2d echo pending 2. Pt clinically euvolemic 6. DVT prophylaxis 1. Lovenox subQ  Consultations:  none  Discharge Exam: Filed Vitals:   11/19/14 2106 11/20/14 0201 11/20/14 0518 11/20/14 1513  BP: 158/93 157/88 135/75 141/82  Pulse: 78 74 72 72  Temp: 97.7 F (36.5 C) 97.9 F (36.6 C) 98.3 F (36.8 C) 98 F (36.7 C)  TempSrc: Oral Oral Oral Oral  Resp: 22 20 18 18   Height:      Weight:       SpO2: 97% 98% 98% 95%    General: Awake, in nad Cardiovascular: regular, s1, s2 Respiratory: normal resp effort, no wheezing  Discharge Instructions     Medication List    STOP taking these medications        rOPINIRole 0.25 MG tablet  Commonly known as:  REQUIP      TAKE these medications        ALPRAZolam 0.5 MG tablet  Commonly known as:  XANAX  Take 0.5 mg by mouth 2 (two) times daily as needed for anxiety.     aspirin 325 MG tablet  Take 325 mg by mouth at bedtime.     carbidopa-levodopa 25-100 MG per tablet  Commonly known as:  SINEMET IR  Take 1-2 tablets by mouth See admin instructions. Take 1 tablet in the morning, 1 tablets at lunch, 1 at 4pm and 1 tablet at night.     METAMUCIL PO  Take 5 mLs by mouth at bedtime.     nebivolol 10 MG tablet  Commonly known as:  BYSTOLIC  Take 10 mg by mouth daily.     polyethylene glycol powder powder  Commonly known as:  GLYCOLAX/MIRALAX  Take 0.5 Containers by mouth daily as needed for mild constipation.     rosuvastatin 20 MG tablet  Commonly known as:  CRESTOR  Take 20 mg by mouth at bedtime.     TYLENOL ARTHRITIS PAIN PO  Take 1 tablet by mouth  daily as needed. For pain     VITAMIN D PO  Take by mouth every morning.       No Known Allergies Follow-up Information    Follow up with Olivia Canter, MD. Schedule an appointment as soon as possible for a visit in 1 week.   Specialty:  Family Medicine   Contact information:   [address] [place] [address] [phone]        The results of significant diagnostics from this hospitalization (including imaging, microbiology, ancillary and laboratory) are listed below for reference.    Significant Diagnostic Studies: Dg Chest 2 View  11/18/2014   CLINICAL DATA:  Syncope, history of coronary artery disease, hypertension, CHF  EXAM: CHEST  2 VIEW  COMPARISON:  07/23/2012  FINDINGS: The heart size and mediastinal contours are within normal limits. Both  lungs are clear. The visualized skeletal structures are unremarkable. Evidence of CABG.  IMPRESSION: No active cardiopulmonary disease.   Electronically Signed   By: Christiana Pellant M.D.   On: 11/18/2014 20:00   Ct Head Wo Contrast  11/19/2014   CLINICAL DATA:  Syncope  EXAM: CT HEAD WITHOUT CONTRAST  TECHNIQUE: Contiguous axial images were obtained from the base of the skull through the vertex without intravenous contrast.  COMPARISON:  None.  FINDINGS: Moderate atrophy. Diffuse cerebral atrophy with ventricular enlargement. No prior studies for comparison.  Mild changes in the periventricular white matter likely related to chronic microvascular ischemia.  Negative for acute infarct.  Negative for hemorrhage or mass lesion.  Calvarium intact.  IMPRESSION: Atrophy and mild chronic microvascular ischemic change in the white matter. No acute abnormality.   Electronically Signed   By: Marlan Palau M.D.   On: 11/19/2014 17:17    Microbiology: No results found for this or any previous visit (from the past 240 hour(s)).   Labs: Basic Metabolic Panel:  Recent Labs Lab 11/18/14 1844 11/19/14 0600 11/20/14 0559  NA 141 141 141  K 3.4* 3.4* 3.8  CL 103 105 105  CO2 27 30 32  GLUCOSE 118* 102* 95  BUN CREATININE 1.02 0.90 1.07  CALCIUM 9.4 9.1 9.5   Liver Function Tests: No results for input(s): AST, ALT, ALKPHOS, BILITOT, PROT, ALBUMIN in the last 168 hours. No results for input(s): LIPASE, AMYLASE in the last 168 hours. No results for input(s): AMMONIA in the last 168 hours. CBC:  Recent Labs Lab 11/18/14 1844 11/19/14 0600  WBC 5.6 6.4  HGB 15.2 14.6  HCT 45.3 43.2  MCV 94.6 94.1  PLT 155 150   Cardiac Enzymes:  Recent Labs Lab 11/19/14 0855  TROPONINI <0.03   BNP: BNP (last 3 results) No results for input(s): BNP in the last 8760 hours.  ProBNP (last 3 results) No results for input(s): PROBNP in the last 8760 hours.  CBG: No results for input(s): GLUCAP in  the last 168 hours.  Signed:  Avaiyah Strubel, Scheryl Marten  [facility] 11/20/2014, 3:47 PM

## 2015-02-22 ENCOUNTER — Encounter: Payer: Self-pay | Admitting: Cardiology

## 2015-02-22 ENCOUNTER — Ambulatory Visit (INDEPENDENT_AMBULATORY_CARE_PROVIDER_SITE_OTHER): Payer: Medicare Other | Admitting: Cardiology

## 2015-02-22 VITALS — BP 136/90 | HR 90 | Ht 72.0 in | Wt 207.2 lb

## 2015-02-22 DIAGNOSIS — I10 Essential (primary) hypertension: Secondary | ICD-10-CM | POA: Diagnosis not present

## 2015-02-22 DIAGNOSIS — I25708 Atherosclerosis of coronary artery bypass graft(s), unspecified, with other forms of angina pectoris: Secondary | ICD-10-CM

## 2015-02-22 DIAGNOSIS — E785 Hyperlipidemia, unspecified: Secondary | ICD-10-CM

## 2015-02-22 NOTE — Patient Instructions (Signed)
Continue your medication but take Bystolic in the evening instead  I will see you in 6 months.

## 2015-02-23 NOTE — Progress Notes (Signed)
Harry Morgan Date of Birth: September 01, 1928 Medical Record #[id number]  History of Present Illness: Harry Morgan is seen today for follow up of CAD. He has known CAD with prior CABG in February 2010. Other issues include HTN and HLD. He is limited by his arthritis and Parkinson's. His last Myoview study in October 2013 was normal. He experienced a syncopal episode on Feb 6. He was admitted. No arrhythmia noted. BP low. Cranial CT and Echo unremarkable. Requip and losartan held. Losartan later resumed and half dose. On follow up today he is feeling very well. Sometimes gets tired in mid day and BP as low as 93 systolic. No chest pain or dyspnea.   Current Outpatient Prescriptions on File Prior to Visit  Medication Sig Dispense Refill  . Acetaminophen (TYLENOL ARTHRITIS PAIN PO) Take 1 tablet by mouth daily as needed. For pain    . aspirin 325 MG tablet Take 325 mg by mouth at bedtime.     . carbidopa-levodopa (SINEMET) 25-100 MG per tablet Take 1-2 tablets by mouth See admin instructions. Take 1 tablet in the morning, 1 tablets at lunch, 1 at 4pm and 1 tablet at night.    . Cholecalciferol (VITAMIN D PO) Take by mouth every morning.    . nebivolol (BYSTOLIC) 10 MG tablet Take 5 mg by mouth daily.     . polyethylene glycol powder (GLYCOLAX/MIRALAX) powder Take 0.5 Containers by mouth daily as needed for mild constipation.     . Psyllium (METAMUCIL PO) Take 5 mLs by mouth at bedtime.    . rosuvastatin (CRESTOR) 20 MG tablet Take 20 mg by mouth at bedtime.      No current facility-administered medications on file prior to visit.    No Known Allergies  Past Medical History  Diagnosis Date  . Coronary artery disease 2010    Status post CABG. This included an LIMA graft to the LAD, saphenous vein graft to the diagonal,  saphenous vein graft to the first and second obtuse marginal vessels, saphenous vein graft to the distal right coronary  . Renal calculi   . Hypertension   . Dyslipidemia    . Atrial fibrillation     Postoperative  . Arthritis of spine   . FHx: coronary artery disease   . Parkinson disease     Past Surgical History  Procedure Laterality Date  . Coronary artery bypass graft  11/2008  . Hernia repair      Status post x3  . Knee surgery      Status post arthroscopic    History  Smoking status  . Former Smoker  Smokeless tobacco  . Not on file    History  Alcohol Use No    History reviewed. No pertinent family history.  Review of Systems: The review of systems is per the HPI.   All other systems were reviewed and are negative.  Physical Exam: BP 136/90 mmHg  Pulse 90  Ht 6' (1.829 m)  Wt 207 lb 3.2 oz (93.985 kg)  BMI 28.10 kg/m2 Patient is very pleasant and in no acute distress. He has a resting tremor R>L. Skin is warm and dry. Color is normal.  HEENT is unremarkable. Normocephalic/atraumatic. PERRL. Sclera are nonicteric. Neck is supple. No masses. No JVD. Lungs are clear. Cardiac exam shows a regular rate and rhythm. Normal S1-2. No gallop or murmur. Abdomen is soft. Extremities are without edema. Neurologic exam reveals a tremor. Gait is unsteady. He is using a cane. ROM appears intact.  No gross neurologic deficits noted.   LABORATORY DATA:   Assessment / Plan:  1.  CAD - prior CABG, normal Myoview study October 2013. Remains asymptomatic.  Continue current medical therapy.  2. HTN - blood pressure under good control. I recommend he take losartan HCT in am and bystolic in the evening to avoid hypotension.   3. Parkinson's disease  4. Hyperlipidemia-on Crestor.  I will follow up in 6 months.  I will follow up in 6 months.

## 2015-03-15 ENCOUNTER — Ambulatory Visit: Payer: Medicare Other | Admitting: Cardiology

## 2015-03-21 ENCOUNTER — Telehealth: Payer: Self-pay

## 2015-03-21 NOTE — Telephone Encounter (Signed)
Received a call from patient's wife she stated husband has been taking aspirin 81 mg daily and not 325 mg.Spoke to Dr.Jordan he advised continue aspirin 81 mg daily.

## 2015-07-25 ENCOUNTER — Encounter: Payer: Self-pay | Admitting: Cardiology

## 2015-07-25 ENCOUNTER — Ambulatory Visit (INDEPENDENT_AMBULATORY_CARE_PROVIDER_SITE_OTHER): Payer: Medicare Other | Admitting: Cardiology

## 2015-07-25 VITALS — BP 148/96 | HR 76 | Ht 72.0 in | Wt 210.2 lb

## 2015-07-25 DIAGNOSIS — I25708 Atherosclerosis of coronary artery bypass graft(s), unspecified, with other forms of angina pectoris: Secondary | ICD-10-CM

## 2015-07-25 DIAGNOSIS — E785 Hyperlipidemia, unspecified: Secondary | ICD-10-CM | POA: Diagnosis not present

## 2015-07-25 DIAGNOSIS — R600 Localized edema: Secondary | ICD-10-CM | POA: Insufficient documentation

## 2015-07-25 DIAGNOSIS — I10 Essential (primary) hypertension: Secondary | ICD-10-CM

## 2015-07-25 MED ORDER — LOSARTAN POTASSIUM 50 MG PO TABS: 50.0000 mg | ORAL_TABLET | Freq: Every day | ORAL | Status: DC

## 2015-07-25 MED ORDER — HYDROCHLOROTHIAZIDE 25 MG PO TABS: 25.0000 mg | ORAL_TABLET | Freq: Every day | ORAL | Status: DC

## 2015-07-25 NOTE — Patient Instructions (Signed)
We will check blood work   Stop losartan HCT  Start losartan 50 mg daily and HCTZ 25 mg daily  Restrict your sodium intake  Keep feet elevated when you can  I will see you in 6 months.

## 2015-07-25 NOTE — Progress Notes (Signed)
Harry Morgan Date of Birth: 09/02/1928 Medical Record #[id number]  History of Present Illness: Mr. Meador is seen today for follow up of CAD. He has known CAD with prior CABG in February 2010. Other issues include HTN and HLD. He is limited by his arthritis and Parkinson's. His last Myoview study in October 2013 was normal. He experienced a syncopal episode in Feb 2016.No arrhythmia noted. BP low. Cranial CT and Echo unremarkable. Requip and losartan HCT held. Losartan HCT later resumed and half dose.  On follow up today he is feeling very well. He denies any chest pain or SOB. He has noted a 2 month history of swelling in his ankles and legs. It is better with elevation. No pain. No recurrent syncope.   Current Outpatient Prescriptions on File Prior to Visit  Medication Sig Dispense Refill  . Acetaminophen (TYLENOL ARTHRITIS PAIN PO) Take 1 tablet by mouth daily as needed. For pain    . aspirin EC 81 MG tablet Take 1 tablet (81 mg total) by mouth daily. 90 tablet 3  . carbidopa-levodopa (SINEMET) 25-100 MG per tablet Take 1-2 tablets by mouth See admin instructions. Take 1 tablet in the morning, 1 tablets at lunch, 1 at 4pm and 1 tablet at night.    . Cholecalciferol (VITAMIN D PO) Take 1,000 mg by mouth every morning.     . nebivolol (BYSTOLIC) 10 MG tablet Take 5 mg by mouth daily.     . polyethylene glycol powder (GLYCOLAX/MIRALAX) powder Take 0.5 Containers by mouth daily as needed for mild constipation.     . Psyllium (METAMUCIL PO) Take 5 mLs by mouth at bedtime.    . rosuvastatin (CRESTOR) 20 MG tablet Take 20 mg by mouth at bedtime.      No current facility-administered medications on file prior to visit.    No Known Allergies  Past Medical History  Diagnosis Date  . Coronary artery disease 2010    Status post CABG. This included an LIMA graft to the LAD, saphenous vein graft to the diagonal,  saphenous vein graft to the first and second obtuse marginal vessels,  saphenous vein graft to the distal right coronary  . Renal calculi   . Hypertension   . Dyslipidemia   . Atrial fibrillation (HCC)     Postoperative  . Arthritis of spine   . FHx: coronary artery disease   . Parkinson disease [facility])     Past Surgical History  Procedure Laterality Date  . Coronary artery bypass graft  11/2008  . Hernia repair      Status post x3  . Knee surgery      Status post arthroscopic    History  Smoking status  . Former Smoker  Smokeless tobacco  . Not on file    History  Alcohol Use No    History reviewed. No pertinent family history.  Review of Systems: The review of systems is per the HPI.   All other systems were reviewed and are negative.  Physical Exam: BP 148/96 mmHg  Pulse 76  Ht 6' (1.829 m)  Wt 95.346 kg (210 lb 3.2 oz)  BMI 28.50 kg/m2 Patient is very pleasant and in no acute distress. He has a resting tremor R>L. Skin is warm and dry. Color is normal.  HEENT is unremarkable. Normocephalic/atraumatic. PERRL. Sclera are nonicteric. Neck is supple. No masses. No JVD. Lungs are clear. Cardiac exam shows a regular rate and rhythm. Normal S1-2. No gallop or murmur. Abdomen is soft.  Extremities reveal 1-2 + edema. Neurologic exam reveals a tremor. Gait is unsteady. He is using a cane. ROM appears intact. No gross neurologic deficits noted.   LABORATORY DATA:   Assessment / Plan:  1.  CAD - prior CABG, normal Myoview study October 2013. Remains asymptomatic.  Continue current medical therapy.  2. HTN - blood pressure under fair control. Continue current dose of Bystolic and losartan.  3. Parkinson's disease  4. Hyperlipidemia-on Crestor.  5. Edema. Will check labs including chemistry panel. CBC, and TSH. Increase HCTZ to 25 mg daily. Support hose as needed. Sodium restriction.   I will follow up in 6 months.

## 2015-07-31 ENCOUNTER — Telehealth: Payer: Self-pay

## 2015-07-31 NOTE — Telephone Encounter (Signed)
Received a call from patient's wife.She stated Dr.Jordan changed husband's medications on 07/25/15 to Losartan 50 mg daily,Hctz 25 mg daily.Stated he has had 2 episodes the past 2 days feels like he is going to pass out.Stated he takes medications at 9:00 am and each episode was around 12:00 noon,lasting a few seconds.Stated he feels ok at present.Continues to have swelling in both ankles.Advised Dr.Jordan is out of office. I will send message to him for advice.

## 2015-07-31 NOTE — Telephone Encounter (Signed)
I would hold losartan for now. Continue HCTZ. [facility]   Peter SwazilandJordan MD, [facility]

## 2015-07-31 NOTE — Telephone Encounter (Signed)
Returned call to patient's wife.Dr.Jordan advised to hold losartan for now.Continue HCTZ.Advised to have lab done.Stated he will have done 10/19 or 10/20.

## 2015-07-31 NOTE — Addendum Note (Signed)
Addended by: Meda KlinefelterPUGH, CHERYL JOHNSON D on: 07/31/2015 05:32 PM   Modules accepted: Orders, Medications

## 2015-08-02 LAB — CBC
HEMATOCRIT: 47 % (ref 39.0–52.0)
Hemoglobin: 15.8 g/dL (ref 13.0–17.0)
MCH: 31.9 pg (ref 26.0–34.0)
MCHC: 33.6 g/dL (ref 30.0–36.0)
MCV: 94.9 fL (ref 78.0–100.0)
MPV: 11.1 fL (ref 8.6–12.4)
PLATELETS: 150 10*3/uL (ref 150–400)
RBC: 4.95 MIL/uL (ref 4.22–5.81)
RDW: 12.9 % (ref 11.5–15.5)
WBC: 6.8 10*3/uL (ref 4.0–10.5)

## 2015-08-02 LAB — LIPID PANEL
CHOL/HDL RATIO: 2.4 ratio (ref <=5.0)
Cholesterol: 98 mg/dL — ABNORMAL LOW (ref 125–200)
HDL: 41 mg/dL (ref >=40)
LDL CALC: 41 mg/dL (ref <130)
Triglycerides: 81 mg/dL (ref <150)
VLDL: 16 mg/dL (ref <30)

## 2015-08-02 LAB — COMPREHENSIVE METABOLIC PANEL
ALT: 7 U/L — AB (ref 9–46)
AST: 19 U/L (ref 10–35)
Albumin: 4.3 g/dL (ref 3.6–5.1)
Alkaline Phosphatase: 69 U/L (ref 40–115)
BUN: 24 mg/dL (ref 7–25)
CALCIUM: 9.7 mg/dL (ref 8.6–10.3)
CO2: 32 mmol/L — ABNORMAL HIGH (ref 20–31)
Chloride: 102 mmol/L (ref 98–110)
Creat: 1.12 mg/dL — ABNORMAL HIGH (ref 0.70–1.11)
Glucose, Bld: 95 mg/dL (ref 65–99)
POTASSIUM: 4.1 mmol/L (ref 3.5–5.3)
Sodium: 140 mmol/L (ref 135–146)
Total Bilirubin: 1.4 mg/dL — ABNORMAL HIGH (ref 0.2–1.2)
Total Protein: 6.9 g/dL (ref 6.1–8.1)

## 2015-08-17 ENCOUNTER — Other Ambulatory Visit: Payer: Self-pay | Admitting: Cardiology

## 2015-08-17 NOTE — Telephone Encounter (Signed)
°*  STAT* If patient is at the pharmacy, call can be transferred to refill team.   1. Which medications need to be refilled? (please list name of each medication and dose if known) Crestor (generic)-New Prescription   2. Which pharmacy/location (including street and city if local pharmacy) [facility] [place]   3. Do they need a 30 day or 90 day supply? 30day

## 2015-08-20 ENCOUNTER — Telehealth: Payer: Self-pay | Admitting: Cardiology

## 2015-08-20 MED ORDER — ROSUVASTATIN CALCIUM 20 MG PO TABS: 20.0000 mg | ORAL_TABLET | Freq: Every day | ORAL | Status: DC

## 2015-08-20 NOTE — Telephone Encounter (Signed)
New message       *STAT* If patient is at the pharmacy, call can be transferred to refill team.   1. Which medications need to be refilled? (please list name of each medication and dose if known) generic crestor 2. Which pharmacy/location (including street and city if local pharmacy) is medication to be sent to? [facility]/ [place] 3. Do they need a 30 day or 90 day supply? 30 Please call pt and let her know this was called in

## 2015-08-20 NOTE — Telephone Encounter (Signed)
Medication sent to pts pharmacy 

## 2015-08-20 NOTE — Telephone Encounter (Signed)
Returned call to patient generic crestor sent to pharmacy.

## 2015-09-05 ENCOUNTER — Ambulatory Visit: Payer: Medicare Other | Admitting: Cardiology

## 2015-09-17 ENCOUNTER — Other Ambulatory Visit: Payer: Self-pay | Admitting: Cardiology

## 2015-09-17 NOTE — Telephone Encounter (Signed)
REFILL 

## 2015-11-22 ENCOUNTER — Telehealth: Payer: Self-pay | Admitting: *Deleted

## 2015-11-22 NOTE — Telephone Encounter (Signed)
Called to give a heads up on the change of their pharmacy to [facility].

## 2015-11-28 ENCOUNTER — Other Ambulatory Visit: Payer: Self-pay | Admitting: *Deleted

## 2015-11-28 MED ORDER — HYDROCHLOROTHIAZIDE 25 MG PO TABS: 25.0000 mg | ORAL_TABLET | Freq: Every day | ORAL | Status: DC

## 2015-11-28 MED ORDER — NEBIVOLOL HCL 5 MG PO TABS: 5.0000 mg | ORAL_TABLET | Freq: Every day | ORAL | Status: DC

## 2015-12-11 ENCOUNTER — Telehealth: Payer: Self-pay | Admitting: Cardiology

## 2015-12-11 MED ORDER — NEBIVOLOL HCL 5 MG PO TABS: 5.0000 mg | ORAL_TABLET | Freq: Every day | ORAL | Status: DC

## 2015-12-11 MED ORDER — HYDROCHLOROTHIAZIDE 25 MG PO TABS: 25.0000 mg | ORAL_TABLET | Freq: Every day | ORAL | Status: DC

## 2015-12-11 NOTE — Telephone Encounter (Signed)
New message       *STAT* If patient is at the pharmacy, call can be transferred to refill team.   1. Which medications need to be refilled? (please list name of each medication and dose if known) bystolic , HCTZ ,  2. Which pharmacy/location (including street and city if local pharmacy) is medication to be sent to? [facility]  3. Do they need a 30 day or 90 day supply? 90 day supply Please fax to [facility] [phone].  They request that we put pt name, mailing address, dob, and ID# [id number] and acct # [id number] on the presc refill because this is a new presc to them

## 2015-12-11 NOTE — Telephone Encounter (Signed)
Refill sent.

## 2015-12-12 ENCOUNTER — Other Ambulatory Visit: Payer: Self-pay | Admitting: *Deleted

## 2015-12-12 MED ORDER — HYDROCHLOROTHIAZIDE 25 MG PO TABS: 25.0000 mg | ORAL_TABLET | Freq: Every day | ORAL | Status: DC

## 2015-12-12 MED ORDER — NEBIVOLOL HCL 5 MG PO TABS: 5.0000 mg | ORAL_TABLET | Freq: Every day | ORAL | Status: DC

## 2016-03-20 ENCOUNTER — Encounter: Payer: Self-pay | Admitting: Cardiology

## 2016-03-20 ENCOUNTER — Ambulatory Visit (INDEPENDENT_AMBULATORY_CARE_PROVIDER_SITE_OTHER): Payer: Medicare Other | Admitting: Cardiology

## 2016-03-20 VITALS — BP 126/80 | HR 84 | Ht 72.0 in | Wt 208.0 lb

## 2016-03-20 DIAGNOSIS — I10 Essential (primary) hypertension: Secondary | ICD-10-CM

## 2016-03-20 DIAGNOSIS — E785 Hyperlipidemia, unspecified: Secondary | ICD-10-CM

## 2016-03-20 DIAGNOSIS — I25708 Atherosclerosis of coronary artery bypass graft(s), unspecified, with other forms of angina pectoris: Secondary | ICD-10-CM | POA: Diagnosis not present

## 2016-03-20 MED ORDER — ROSUVASTATIN CALCIUM 10 MG PO TABS: 10.0000 mg | ORAL_TABLET | Freq: Every day | ORAL | Status: DC

## 2016-03-20 NOTE — Progress Notes (Signed)
Harry Morgan Date of Birth: [birth year] Medical Record #[id number]  History of Present Illness: Harry Morgan is seen today for follow up of CAD. He has known CAD with prior CABG in February 2010. Other issues include HTN and HLD. He is limited by his arthritis and Parkinson's. His last Myoview study in October 2013 was normal. He experienced a syncopal episode in Feb 2016.No arrhythmia noted. BP low. Cranial CT and Echo unremarkable. Requip and losartan HCT held. Losartan HCT later resumed at half dose.   On follow up today he is feeling very well. He denies any chest pain or SOB. No recurrent syncope. BP readings at home have been well controlled. Bystolic is costing him about $40/JWJXB but this has been working well for him and he wants to continue taking. He is limited by severe arthritis in his knees. Tried Flexgenics without improvement. He reports his Crestor was stopped by Dr. Tresa Endo because his cholesterol was "too low".   Current Outpatient Prescriptions on File Prior to Visit  Medication Sig Dispense Refill  . Acetaminophen (TYLENOL ARTHRITIS PAIN PO) Take 1 tablet by mouth daily as needed. For pain    . aspirin EC 81 MG tablet Take 1 tablet (81 mg total) by mouth daily. 90 tablet 3  . carbidopa-levodopa (SINEMET) 25-100 MG per tablet Take 1-2 tablets by mouth See admin instructions. Take 1 tablet in the morning, 1 tablets at lunch, 1 at 4pm and 1 tablet at night.    . Cholecalciferol (VITAMIN D PO) Take 1,000 mg by mouth every morning.     . hydrochlorothiazide (HYDRODIURIL) 25 MG tablet Take 1 tablet (25 mg total) by mouth daily. 90 tablet 1  . mirtazapine (REMERON) 15 MG tablet TAKE 1 TAB(S) ONCE A DAY (AT BEDTIME) ORALLY  11  . nebivolol (BYSTOLIC) 5 MG tablet Take 1 tablet (5 mg total) by mouth daily. 90 tablet 1  . polyethylene glycol powder (GLYCOLAX/MIRALAX) powder Take 0.5 Containers by mouth daily as needed for mild constipation.     . Psyllium (METAMUCIL PO) Take 5 mLs  by mouth at bedtime.     No current facility-administered medications on file prior to visit.    No Known Allergies  Past Medical History  Diagnosis Date  . Coronary artery disease 2010    Status post CABG. This included an LIMA graft to the LAD, saphenous vein graft to the diagonal,  saphenous vein graft to the first and second obtuse marginal vessels, saphenous vein graft to the distal right coronary  . Renal calculi   . Hypertension   . Dyslipidemia   . Atrial fibrillation (HCC)     Postoperative  . Arthritis of spine   . FHx: coronary artery disease   . Parkinson disease [facility])     Past Surgical History  Procedure Laterality Date  . Coronary artery bypass graft  11/2008  . Hernia repair      Status post x3  . Knee surgery      Status post arthroscopic    History  Smoking status  . Former Smoker  Smokeless tobacco  . Not on file    History  Alcohol Use No    History reviewed. No pertinent family history.  Review of Systems: The review of systems is per the HPI.   All other systems were reviewed and are negative.  Physical Exam: BP 126/80 mmHg  Pulse 84  Ht 6' (1.829 m)  Wt 208 lb (94.348 kg)  BMI 28.20 kg/m2 Patient is  very pleasant and in no acute distress. He has a resting tremor R>L. Skin is warm and dry. Color is normal.  HEENT is unremarkable. Normocephalic/atraumatic. PERRL. Sclera are nonicteric. Neck is supple. No masses. No JVD. Lungs are clear. Cardiac exam shows a regular rate and rhythm. Normal S1-2. No gallop or murmur. Abdomen is soft. Extremities reveal 1 + edema. Neurologic exam reveals a tremor. Gait is unsteady. He is using a cane. ROM appears intact. No gross neurologic deficits noted.   LABORATORY DATA: Lab Results  Component Value Date   WBC 6.8 08/01/2015   HGB 15.8 08/01/2015   HCT 47.0 08/01/2015   PLT 150 08/01/2015   GLUCOSE 95 08/01/2015   CHOL 98* 08/01/2015   TRIG 81 08/01/2015   HDL 41 08/01/2015   LDLCALC 41 08/01/2015    ALT 7* 08/01/2015   AST 19 08/01/2015   NA 140 08/01/2015   K 4.1 08/01/2015   CL 102 08/01/2015   CREATININE 1.12* 08/01/2015   BUN 24 08/01/2015   CO2 32* 08/01/2015   TSH 1.873 Test methodology is 3rd generation TSH 12/03/2008   INR 1.5 12/13/2008   HGBA1C  12/06/2008    5.3 (NOTE)   The ADA recommends the following therapeutic goal for glycemic   control related to Hgb A1C measurement:   Goal of Therapy:   < 7.0% Hgb A1C   Reference: American Diabetes Association: Clinical Practice   Recommendations 2008, Diabetes Care,  2008, 31:(Suppl 1).   Ecg today shows NSR with normal Ecg. I have personally reviewed and interpreted this study.   Assessment / Plan:  1.  CAD - prior CABG in 2010. Normal Myoview study October 2013. Remains asymptomatic.  Continue current medical therapy.  2. HTN - blood pressure under excellent control. Continue current dose of Bystolic and losartan.  3. Parkinson's disease  4. Hyperlipidemia-Cholesterol was low on prior crestor dose. I think we can safely reduce his dose but I would not stop altogether. Will try  10 mg daily and repeat labs next visit.   5. Edema. Improved.   I will follow up in 6 months.

## 2016-03-20 NOTE — Patient Instructions (Signed)
Resume crestor 10 mg daily  Continue your other therapy  I will see you in 6 months with fasting lab

## 2016-07-11 ENCOUNTER — Telehealth: Payer: Self-pay | Admitting: Cardiology

## 2016-07-11 MED ORDER — NEBIVOLOL HCL 2.5 MG PO TABS: 2.5000 mg | ORAL_TABLET | Freq: Every day | ORAL | 1 refills | Status: DC

## 2016-07-11 NOTE — Telephone Encounter (Signed)
Returned call to patient's wife. She notes BPs have been running 90s systolic for about 1-1.5 weeks. No discernable problems - patient not reporting any symptoms. However, she does note he is not ambulatory much at all due to arthritis in knees. No changes to diet or fluid intake. No acute illness. Only med changes recently were a 1 week course of prednisone and some steroid injections for pain management.  Wife wanted advice on what to do. Informed her I would route to Dr. SwazilandJordan for recommendations and see if any med adjustments warranted.

## 2016-07-11 NOTE — Telephone Encounter (Signed)
Recommendations and instructions on med change were communicated to wife, who voiced understanding and thanks.

## 2016-07-11 NOTE — Telephone Encounter (Signed)
Left msg for patient/wife to call.

## 2016-07-11 NOTE — Telephone Encounter (Signed)
Please call,pt blood pressure is really low. Today it was 88/68 and pule rate is79,he does not feel bad.She says she is really concerned about this.Sent this to triage,Cheryl was away from her desk and she wants to talk to somebody asap.

## 2016-07-11 NOTE — Telephone Encounter (Signed)
I would reduce bystolic to 2.5 mg daily. Monitor.   Harout Scheurich SwazilandJordan MD, [facility]

## 2016-09-05 ENCOUNTER — Other Ambulatory Visit: Payer: Self-pay | Admitting: Cardiology

## 2016-09-08 ENCOUNTER — Other Ambulatory Visit: Payer: Self-pay | Admitting: *Deleted

## 2016-09-08 MED ORDER — HYDROCHLOROTHIAZIDE 25 MG PO TABS: 25.0000 mg | ORAL_TABLET | Freq: Every day | ORAL | 3 refills | Status: DC

## 2016-09-24 ENCOUNTER — Other Ambulatory Visit: Payer: Self-pay | Admitting: Cardiology

## 2016-09-24 LAB — LIPID PANEL
CHOLESTEROL: 105 mg/dL (ref <200)
HDL: 45 mg/dL (ref >40)
LDL Cholesterol: 44 mg/dL (ref <100)
TRIGLYCERIDES: 80 mg/dL (ref <150)
Total CHOL/HDL Ratio: 2.3 Ratio (ref <5.0)
VLDL: 16 mg/dL (ref <30)

## 2016-09-24 LAB — HEPATIC FUNCTION PANEL
ALBUMIN: 4 g/dL (ref 3.6–5.1)
ALT: 6 U/L — AB (ref 9–46)
AST: 20 U/L (ref 10–35)
Alkaline Phosphatase: 76 U/L (ref 40–115)
Bilirubin, Direct: 0.3 mg/dL — ABNORMAL HIGH (ref <=0.2)
Indirect Bilirubin: 1 mg/dL (ref 0.2–1.2)
TOTAL PROTEIN: 6.9 g/dL (ref 6.1–8.1)
Total Bilirubin: 1.3 mg/dL — ABNORMAL HIGH (ref 0.2–1.2)

## 2016-09-24 LAB — BASIC METABOLIC PANEL
BUN: 17 mg/dL (ref 7–25)
CALCIUM: 9.8 mg/dL (ref 8.6–10.3)
CHLORIDE: 101 mmol/L (ref 98–110)
CO2: 29 mmol/L (ref 20–31)
Creat: 1.15 mg/dL — ABNORMAL HIGH (ref 0.70–1.11)
Glucose, Bld: 101 mg/dL — ABNORMAL HIGH (ref 65–99)
Potassium: 3.9 mmol/L (ref 3.5–5.3)
Sodium: 142 mmol/L (ref 135–146)

## 2016-09-25 ENCOUNTER — Telehealth: Payer: Self-pay | Admitting: Cardiology

## 2016-09-25 NOTE — Telephone Encounter (Signed)
New message   Pt wife calling for lab results

## 2016-09-25 NOTE — Telephone Encounter (Signed)
Advised wife of labs, ok per DPR  Notes Recorded by Charna Elizabethheryl Johnson D Pugh, LPN on 16/10/960412/14/2017 at 11:07 AM EST Patient called no answer.LMTC.  Notes Recorded by Peter M SwazilandJordan, MD on 09/25/2016 at 8:18 AM EST Chemistries are ok. Mild renal insufficiency is unchanged.  Notes Recorded by Peter M SwazilandJordan, MD on 09/25/2016 at 8:18 AM EST Lipids are excellent

## 2016-09-28 NOTE — Progress Notes (Signed)
Harry Morgan Defrank Date of Birth: 11-27-1927 Medical Record #[id number]  History of Present Illness: Mr. Nancy NordmannWilliard is seen today for follow up of CAD. He has known CAD with prior CABG in February 2010. Other issues include HTN and HLD. He is limited by his arthritis and Parkinson's. His last Myoview study in October 2013 was normal. He experienced a syncopal episode in Feb 2016.No arrhythmia noted. BP low. Cranial CT and Echo unremarkable. Requip and losartan HCT held.   On follow up today he is feeling OK. He denies any chest pain or SOB. No recurrent syncope. BP readings at home have been well controlled. Bystolic dose reduced.  He is limited by severe arthritis in his knees. Has received injections in the past. Told he may require knee surgery.  Current Outpatient Prescriptions on File Prior to Visit  Medication Sig Dispense Refill  . Acetaminophen (TYLENOL ARTHRITIS PAIN PO) Take 1 tablet by mouth daily as needed. For pain    . aspirin EC 81 MG tablet Take 1 tablet (81 mg total) by mouth daily. 90 tablet 3  . carbidopa-levodopa (SINEMET) 25-100 MG per tablet Take 1-2 tablets by mouth See admin instructions. Take 1 tablet in the morning, 1 tablets at lunch, 1 at 4pm and 1 tablet at night.    . Cholecalciferol (VITAMIN D PO) Take 1,000 mg by mouth every morning.     . hydrochlorothiazide (HYDRODIURIL) 25 MG tablet Take 1 tablet (25 mg total) by mouth daily. 90 tablet 3  . mirtazapine (REMERON) 15 MG tablet TAKE 1 TAB(S) ONCE A DAY (AT BEDTIME) ORALLY  11  . nebivolol (BYSTOLIC) 2.5 MG tablet Take 1 tablet (2.5 mg total) by mouth daily. 90 tablet 1  . polyethylene glycol powder (GLYCOLAX/MIRALAX) powder Take 0.5 Containers by mouth daily as needed for mild constipation.     . Psyllium (METAMUCIL PO) Take 5 mLs by mouth at bedtime.    . rosuvastatin (CRESTOR) 10 MG tablet Take 1 tablet (10 mg total) by mouth daily. 90 tablet 3   No current facility-administered medications on file prior to  visit.     No Known Allergies  Past Medical History:  Diagnosis Date  . Arthritis of spine (HCC)   . Atrial fibrillation (HCC)    Postoperative  . Coronary artery disease 2010   Status post CABG. This included an LIMA graft to the LAD, saphenous vein graft to the diagonal,  saphenous vein graft to the first and second obtuse marginal vessels, saphenous vein graft to the distal right coronary  . Dyslipidemia   . FHx: coronary artery disease   . Hypertension   . Parkinson disease (HCC)   . Renal calculi     Past Surgical History:  Procedure Laterality Date  . CORONARY ARTERY BYPASS GRAFT  11/2008  . HERNIA REPAIR     Status post x3  . KNEE SURGERY     Status post arthroscopic    History  Smoking Status  . Former Smoker  Smokeless Tobacco  . Not on file    History  Alcohol Use No    History reviewed. No pertinent family history.  Review of Systems: The review of systems is per the HPI.   All other systems were reviewed and are negative.  Physical Exam: BP 110/80 (BP Location: Left Arm, Patient Position: Sitting, Cuff Size: Normal)   Pulse 67   Ht 6' (1.829 m)   Wt 205 lb 9.6 oz (93.3 kg)   SpO2 97%   BMI  27.88 kg/m  Patient is very pleasant and in no acute distress. He has a resting tremor R>Morgan. Skin is warm and dry. Color is normal.  HEENT is unremarkable. Normocephalic/atraumatic. PERRL. Sclera are nonicteric. Neck is supple. No masses. No JVD. Lungs are clear. Cardiac exam shows a regular rate and rhythm. Normal S1-2. No gallop or murmur. Abdomen is soft. Extremities reveal tr edema. Neurologic exam reveals a tremor. Gait is unsteady. He is using a cane. ROM appears intact. No gross neurologic deficits noted.   LABORATORY DATA: Lab Results  Component Value Date   WBC 6.8 08/01/2015   HGB 15.8 08/01/2015   HCT 47.0 08/01/2015   PLT 150 08/01/2015   GLUCOSE 101 (H) 09/24/2016   CHOL 105 09/24/2016   TRIG 80 09/24/2016   HDL 45 09/24/2016   LDLCALC 44  09/24/2016   ALT 6 (Morgan) 09/24/2016   AST 20 09/24/2016   NA 142 09/24/2016   K 3.9 09/24/2016   CL 101 09/24/2016   CREATININE 1.15 (H) 09/24/2016   BUN 17 09/24/2016   CO2 29 09/24/2016   TSH 1.873 Test methodology is 3rd generation TSH 12/03/2008   INR 1.5 12/13/2008   HGBA1C  12/06/2008    5.3 (NOTE)   The ADA recommends the following therapeutic goal for glycemic   control related to Hgb A1C measurement:   Goal of Therapy:   < 7.0% Hgb A1C   Reference: American Diabetes Association: Clinical Practice   Recommendations 2008, Diabetes Care,  2008, 31:(Suppl 1).     Assessment / Plan:  1.  CAD - prior CABG in 2010. Normal Myoview study October 2013. Remains asymptomatic.  Continue current medical therapy.  2. HTN - blood pressure under excellent control. Continue current dose of Bystolic and HCTZ  3. Parkinson's disease  4. Hyperlipidemia-Cholesterol is excellent on Crestor. Continue therapy  5. Edema. Improved.   6. Severe osteoarthritis of the knees. We discussed surgery. He would have at least moderate surgical risk due to age and heart disease. He doesn't really want surgery at his age and I think it would be advisable to continue conservative measures.   I will follow up in 6 months.

## 2016-09-30 ENCOUNTER — Ambulatory Visit (INDEPENDENT_AMBULATORY_CARE_PROVIDER_SITE_OTHER): Payer: Medicare Other | Admitting: Cardiology

## 2016-09-30 ENCOUNTER — Encounter: Payer: Self-pay | Admitting: Cardiology

## 2016-09-30 VITALS — BP 110/80 | HR 67 | Ht 72.0 in | Wt 205.6 lb

## 2016-09-30 DIAGNOSIS — E785 Hyperlipidemia, unspecified: Secondary | ICD-10-CM

## 2016-09-30 DIAGNOSIS — I25708 Atherosclerosis of coronary artery bypass graft(s), unspecified, with other forms of angina pectoris: Secondary | ICD-10-CM | POA: Diagnosis not present

## 2016-09-30 DIAGNOSIS — I10 Essential (primary) hypertension: Secondary | ICD-10-CM | POA: Diagnosis not present

## 2016-09-30 NOTE — Patient Instructions (Signed)
Continue your therapy  I will see you in 6 months.  

## 2016-10-30 ENCOUNTER — Other Ambulatory Visit: Payer: Self-pay | Admitting: Cardiology

## 2017-02-23 ENCOUNTER — Other Ambulatory Visit: Payer: Self-pay | Admitting: Cardiology

## 2017-04-12 NOTE — Progress Notes (Signed)
Mingo Amberhomas L Hilmes Date of Birth: 1927-11-05 Medical Record #[id number]  History of Present Illness: [name] is seen today for follow up of CAD. He has known CAD with prior CABG in February 2010. Other issues include HTN and HLD. He is limited by his arthritis and Parkinson's. His last Myoview study in October 2013 was normal. He experienced a syncopal episode in Feb 2016.No arrhythmia noted. BP low. Cranial CT and Echo unremarkable. Requip and losartan HCT held.   On follow up today he is feeling OK. He denies any chest pain or SOB. No recurrent syncope or dizziness.   He is limited by severe arthritis in his knees. Not felt to be a good surgical candidate due to age and poor functional status. Seen recently by primary care. Noted to have a high PSA and is going to see Urology.  Current Outpatient Prescriptions on File Prior to Visit  Medication Sig Dispense Refill  . Acetaminophen (TYLENOL ARTHRITIS PAIN PO) Take 1 tablet by mouth daily as needed. For pain    . aspirin EC 81 MG tablet Take 1 tablet (81 mg total) by mouth daily. 90 tablet 3  . carbidopa-levodopa (SINEMET) 25-100 MG per tablet Take 1-2 tablets by mouth See admin instructions. Take 1 tablet in the morning, 1 tablets at lunch, 1 at 4pm and 1 tablet at night.    . Cholecalciferol (VITAMIN D PO) Take 1,000 mg by mouth every morning.     . hydrochlorothiazide (HYDRODIURIL) 25 MG tablet Take 1 tablet (25 mg total) by mouth daily. 90 tablet 3  . mirtazapine (REMERON) 15 MG tablet TAKE 1 TAB(S) ONCE A DAY (AT BEDTIME) ORALLY  11  . nebivolol (BYSTOLIC) 2.5 MG tablet Take 1 tablet (2.5 mg total) by mouth daily. 90 tablet 1  . polyethylene glycol powder (GLYCOLAX/MIRALAX) powder Take 0.5 Containers by mouth daily as needed for mild constipation.     . Psyllium (METAMUCIL PO) Take 5 mLs by mouth at bedtime.    . rosuvastatin (CRESTOR) 10 MG tablet TAKE 1 TABLET ONE TIME DAILY 90 tablet 3   No current facility-administered  medications on file prior to visit.     No Known Allergies  Past Medical History:  Diagnosis Date  . Arthritis of spine (HCC)   . Atrial fibrillation (HCC)    Postoperative  . Coronary artery disease 2010   Status post CABG. This included an LIMA graft to the LAD, saphenous vein graft to the diagonal,  saphenous vein graft to the first and second obtuse marginal vessels, saphenous vein graft to the distal right coronary  . Dyslipidemia   . FHx: coronary artery disease   . Hypertension   . Parkinson disease (HCC)   . Renal calculi     Past Surgical History:  Procedure Laterality Date  . CORONARY ARTERY BYPASS GRAFT  11/2008  . HERNIA REPAIR     Status post x3  . KNEE SURGERY     Status post arthroscopic    History  Smoking Status  . Former Smoker  Smokeless Tobacco  . Never Used    History  Alcohol Use No    History reviewed. No pertinent family history.  Review of Systems: The review of systems is per the HPI.   All other systems were reviewed and are negative.  Physical Exam: BP 118/76   Pulse 91   Ht 6' (1.829 m)   Wt 202 lb (91.6 kg)   BMI 27.40 kg/m  Patient is very pleasant and  in no acute distress. He has a resting tremor R>L. Skin is warm and dry. Color is normal.  HEENT is unremarkable. Normocephalic/atraumatic. PERRL. Sclera are nonicteric. Neck is supple. No masses. No JVD. Lungs are clear. Cardiac exam shows a regular rate and rhythm. Normal S1-2. No gallop or murmur. Abdomen is soft. Extremities reveal tr edema. Neurologic exam reveals a tremor. Gait is unsteady. He is using a cane. ROM appears intact. No gross neurologic deficits noted.   LABORATORY DATA: Labs dated 03/31/17: creatinine 1.2 otherwise CMET normal. Hgb 16. plts 148 K. Cholesterol 103, triglycerides 65, HDL 44, LDL 46. PSA 31,   Ecg today shows NSR with normal Ecg. I have personally reviewed and interpreted this study.   Assessment / Plan:  1.  CAD - prior CABG in 2010. Normal  Myoview study October 2013. Remains asymptomatic.  Continue current medical therapy.  2. HTN - blood pressure under good control. No orthostatic symptoms. Continue current dose of Bystolic and HCTZ  3. Parkinson's disease  4. Hyperlipidemia-Cholesterol is excellent on Crestor. Continue therapy  5. Severe osteoarthritis of the knees. Continue conservative therapy.  6. Elevated PSA- per Urology.  I will follow up in 6 months.

## 2017-04-14 ENCOUNTER — Other Ambulatory Visit: Payer: Self-pay

## 2017-04-14 ENCOUNTER — Ambulatory Visit (INDEPENDENT_AMBULATORY_CARE_PROVIDER_SITE_OTHER): Payer: Medicare Other | Admitting: Cardiology

## 2017-04-14 ENCOUNTER — Encounter: Payer: Self-pay | Admitting: Cardiology

## 2017-04-14 VITALS — BP 118/76 | HR 91 | Ht 72.0 in | Wt 202.0 lb

## 2017-04-14 DIAGNOSIS — I10 Essential (primary) hypertension: Secondary | ICD-10-CM | POA: Diagnosis not present

## 2017-04-14 DIAGNOSIS — I25708 Atherosclerosis of coronary artery bypass graft(s), unspecified, with other forms of angina pectoris: Secondary | ICD-10-CM | POA: Diagnosis not present

## 2017-04-14 DIAGNOSIS — E785 Hyperlipidemia, unspecified: Secondary | ICD-10-CM | POA: Diagnosis not present

## 2017-04-14 DIAGNOSIS — I209 Angina pectoris, unspecified: Secondary | ICD-10-CM | POA: Diagnosis not present

## 2017-04-14 MED ORDER — NEBIVOLOL HCL 2.5 MG PO TABS: 2.5000 mg | ORAL_TABLET | Freq: Every day | ORAL | 3 refills | Status: DC

## 2017-04-14 NOTE — Patient Instructions (Signed)
Continue your current therapy  I will see you in 6  Months   

## 2017-06-01 ENCOUNTER — Telehealth: Payer: Self-pay | Admitting: Cardiology

## 2017-06-01 NOTE — Telephone Encounter (Signed)
Looks like he is just taking 2.5 mg of Bystolic. BP running low. I would just stop Bystolic and continue to monitor.  Peter Swaziland MD, [facility]

## 2017-06-01 NOTE — Telephone Encounter (Signed)
Spoke with pt wife, these are his blood pressure readings over the last 3 days. She reports the patient feels fine with no dizziness or problems. She wanted to let dr [place] know what his bp was running since the decrease in bystolic. Will forward to dr [place] to review and advise.

## 2017-06-01 NOTE — Telephone Encounter (Signed)
New message    Pt wife is calling.   Pt c/o BP issue: STAT if pt c/o blurred vision, one-sided weakness or slurred speech  1. What are your last 5 BP readings? 91/72, 98/70, 101/77  2. Are you having any other symptoms (ex. Dizziness, headache, blurred vision, passed out)? Per wife, pt states he feels ok.  3. What is your BP issue? Pt BP is running low.

## 2017-06-02 NOTE — Telephone Encounter (Signed)
Returned call to patient's wife Dr.Jordan recommendations given.Advised to monitor B/P and call back if needed.

## 2017-06-26 ENCOUNTER — Emergency Department (HOSPITAL_BASED_OUTPATIENT_CLINIC_OR_DEPARTMENT_OTHER): Payer: Medicare Other

## 2017-06-26 ENCOUNTER — Encounter (HOSPITAL_BASED_OUTPATIENT_CLINIC_OR_DEPARTMENT_OTHER): Payer: Self-pay | Admitting: Emergency Medicine

## 2017-06-26 ENCOUNTER — Telehealth: Payer: Self-pay | Admitting: Cardiology

## 2017-06-26 ENCOUNTER — Emergency Department (HOSPITAL_BASED_OUTPATIENT_CLINIC_OR_DEPARTMENT_OTHER)
Admission: EM | Admit: 2017-06-26 | Discharge: 2017-06-26 | Disposition: A | Discharge status: Home / Self Care | Payer: Medicare Other | Attending: Emergency Medicine | Admitting: Emergency Medicine

## 2017-06-26 DIAGNOSIS — I10 Essential (primary) hypertension: Secondary | ICD-10-CM | POA: Diagnosis not present

## 2017-06-26 DIAGNOSIS — R000 Tachycardia, unspecified: Secondary | ICD-10-CM | POA: Diagnosis not present

## 2017-06-26 DIAGNOSIS — Z7982 Long term (current) use of aspirin: Secondary | ICD-10-CM | POA: Diagnosis not present

## 2017-06-26 DIAGNOSIS — Z79899 Other long term (current) drug therapy: Secondary | ICD-10-CM | POA: Diagnosis not present

## 2017-06-26 DIAGNOSIS — Z951 Presence of aortocoronary bypass graft: Secondary | ICD-10-CM | POA: Diagnosis not present

## 2017-06-26 DIAGNOSIS — R002 Palpitations: Secondary | ICD-10-CM | POA: Diagnosis present

## 2017-06-26 DIAGNOSIS — I2510 Atherosclerotic heart disease of native coronary artery without angina pectoris: Secondary | ICD-10-CM | POA: Insufficient documentation

## 2017-06-26 DIAGNOSIS — Z87891 Personal history of nicotine dependence: Secondary | ICD-10-CM | POA: Diagnosis not present

## 2017-06-26 LAB — URINALYSIS, ROUTINE W REFLEX MICROSCOPIC
BILIRUBIN URINE: NEGATIVE
Glucose, UA: NEGATIVE mg/dL
HGB URINE DIPSTICK: NEGATIVE
KETONES UR: NEGATIVE mg/dL
Leukocytes, UA: NEGATIVE
NITRITE: NEGATIVE
PH: 6.5 (ref 5.0–8.0)
Protein, ur: NEGATIVE mg/dL

## 2017-06-26 LAB — CBC
HCT: 45.6 % (ref 39.0–52.0)
Hemoglobin: 15.6 g/dL (ref 13.0–17.0)
MCH: 33.1 pg (ref 26.0–34.0)
MCHC: 34.2 g/dL (ref 30.0–36.0)
MCV: 96.8 fL (ref 78.0–100.0)
PLATELETS: 135 10*3/uL — AB (ref 150–400)
RBC: 4.71 MIL/uL (ref 4.22–5.81)
RDW: 12.7 % (ref 11.5–15.5)
WBC: 5.9 10*3/uL (ref 4.0–10.5)

## 2017-06-26 LAB — COMPREHENSIVE METABOLIC PANEL
ALT: 6 U/L — AB (ref 17–63)
ANION GAP: 9 (ref 5–15)
AST: 19 U/L (ref 15–41)
Albumin: 4.1 g/dL (ref 3.5–5.0)
Alkaline Phosphatase: 74 U/L (ref 38–126)
BUN: 19 mg/dL (ref 6–20)
CHLORIDE: 102 mmol/L (ref 101–111)
CO2: 28 mmol/L (ref 22–32)
Calcium: 9.4 mg/dL (ref 8.9–10.3)
Creatinine, Ser: 0.94 mg/dL (ref 0.61–1.24)
Glucose, Bld: 109 mg/dL — ABNORMAL HIGH (ref 65–99)
POTASSIUM: 3.2 mmol/L — AB (ref 3.5–5.1)
SODIUM: 139 mmol/L (ref 135–145)
Total Bilirubin: 1.3 mg/dL — ABNORMAL HIGH (ref 0.3–1.2)
Total Protein: 7 g/dL (ref 6.5–8.1)

## 2017-06-26 LAB — TROPONIN I

## 2017-06-26 MED ORDER — POTASSIUM CHLORIDE CRYS ER 20 MEQ PO TBCR
20.0000 meq | EXTENDED_RELEASE_TABLET | Freq: Once | ORAL | Status: AC
  Administered 2017-06-26: 20 meq via ORAL
  Filled 2017-06-26: qty 1

## 2017-06-26 MED ORDER — POTASSIUM CHLORIDE ER 20 MEQ PO TBCR: 10.0000 meq | EXTENDED_RELEASE_TABLET | Freq: Every day | ORAL | 0 refills | Status: DC

## 2017-06-26 MED ORDER — POTASSIUM CHLORIDE ER 20 MEQ PO TBCR: 20.0000 meq | EXTENDED_RELEASE_TABLET | Freq: Every day | ORAL | 0 refills | Status: DC

## 2017-06-26 MED ORDER — SODIUM CHLORIDE 0.9 % IV BOLUS (SEPSIS)
500.0000 mL | Freq: Once | INTRAVENOUS | Status: AC
  Administered 2017-06-26: 500 mL via INTRAVENOUS

## 2017-06-26 NOTE — ED Provider Notes (Signed)
[facility] Note   CSN: [id number] Arrival date & time: 06/26/17  1508  History   Chief Complaint Chief Complaint  Patient presents with  . Palpitations    HPI Harry Morgan is a 83 y.o. male with PMH of parkinson's disease, CAD, arthritis, and HTN who presents with 4-5 day history of quivering in his chest. His right side has severe resting tremor but lately he feels his chest is shaking as well "on the inside". He denies chest pain, SOB, feeling like his heart is racing or skipping a beat. He recently was told to stop his bystolic about 2 weeks ago due to low blood pressures. He denies fevers, chills. Endorses frequent urination, denies dysuria. No diarrhea, constipation, blood in stool. Per wife he is at his baseline.  HPI  Past Medical History:  Diagnosis Date  . Arthritis of spine (HCC)   . Atrial fibrillation (HCC)    Postoperative  . Coronary artery disease 2010   Status post CABG. This included an LIMA graft to the LAD, saphenous vein graft to the diagonal,  saphenous vein graft to the first and second obtuse marginal vessels, saphenous vein graft to the distal right coronary  . Dyslipidemia   . FHx: coronary artery disease   . Hypertension   . Parkinson disease (HCC)   . Renal calculi     Patient Active Problem List   Diagnosis Date Noted  . Edema leg 07/25/2015  . Parkinson disease (HCC)   . Syncope 11/18/2014  . Coronary artery disease   . Hypertension   . Dyslipidemia     Past Surgical History:  Procedure Laterality Date  . CORONARY ARTERY BYPASS GRAFT  11/2008  . HERNIA REPAIR     Status post x3  . KNEE SURGERY     Status post arthroscopic       Home Medications    Prior to Admission medications   Medication Sig Start Date End Date Taking? Authorizing Provider  Acetaminophen (TYLENOL ARTHRITIS PAIN PO) Take 1 tablet by mouth daily as needed. For pain    [provider]  aspirin EC 81 MG tablet Take 1 tablet (81  mg total) by mouth daily. 03/21/15   [place], Peter M, MD  carbidopa-levodopa (SINEMET) 25-100 MG per tablet Take 1-2 tablets by mouth See admin instructions. Take 1 tablet in the morning, 1 tablets at lunch, 1 at 4pm and 1 tablet at night.    [provider]  Cholecalciferol (VITAMIN D PO) Take 1,000 mg by mouth every morning.     [provider]  hydrochlorothiazide (HYDRODIURIL) 25 MG tablet Take 1 tablet (25 mg total) by mouth daily. 09/08/16   [place], Peter M, MD  mirtazapine (REMERON) 15 MG tablet TAKE 1 TAB(S) ONCE A DAY (AT BEDTIME) ORALLY 06/30/15   [provider]  polyethylene glycol powder (GLYCOLAX/MIRALAX) powder Take 0.5 Containers by mouth daily as needed for mild constipation.  08/24/12   [provider]  Potassium Chloride ER 20 MEQ TBCR Take 20 mEq by mouth daily. 06/26/17   Tillman Sers, DO  Psyllium (METAMUCIL PO) Take 5 mLs by mouth at bedtime.    [provider]  rosuvastatin (CRESTOR) 10 MG tablet TAKE 1 TABLET ONE TIME DAILY 02/24/17   [place], Peter M, MD  Zoster Vac Recomb Adjuvanted [facility]) injection Inject 0.5 mLs into the muscle once.    [provider]    Family History No family history on file.  Social History Social  History  Substance Use Topics  . Smoking status: Former Games developer  . Smokeless tobacco: Never Used  . Alcohol use No     Allergies   Patient has no known allergies.   Review of Systems Review of Systems  Constitutional: Negative for activity change, appetite change, chills, diaphoresis, fatigue and fever.  HENT: Negative for congestion, sinus pain and sore throat.   Eyes: Negative for pain.  Respiratory: Negative for cough, chest tightness and shortness of breath.   Cardiovascular: Negative for chest pain, palpitations and leg swelling.  Gastrointestinal: Negative for abdominal distention, abdominal pain, blood in stool, constipation, diarrhea, nausea and vomiting.  Genitourinary:  Positive for frequency and urgency. Negative for dysuria.  Musculoskeletal: Negative for back pain and myalgias.  Skin: Negative for rash.  Neurological: Positive for tremors. Negative for seizures, weakness and headaches.     Physical Exam Updated Vital Signs BP (!) 139/100   Pulse (!) 106   Temp 97.6 F (36.4 C) (Oral)   Resp 20   Ht 6' (1.829 m)   Wt 93 kg (205 lb)   SpO2 100%   BMI 27.80 kg/m   Physical Exam  Constitutional: He appears well-developed and well-nourished. No distress.  HENT:  Head: Normocephalic and atraumatic.  Mouth/Throat: Oropharynx is clear and moist. No oropharyngeal exudate.  Eyes: Pupils are equal, round, and reactive to light. EOM are normal. Right eye exhibits no discharge. Left eye exhibits no discharge.  Neck: Normal range of motion. Neck supple. No thyromegaly present.  Cardiovascular: Regular rhythm and intact distal pulses.   No murmur heard. Tachycardic  Pulmonary/Chest: Effort normal and breath sounds normal. No respiratory distress.  Abdominal: Soft. He exhibits no distension. There is no tenderness. There is no guarding.  Musculoskeletal: Normal range of motion. He exhibits no tenderness.  Lymphadenopathy:    He has no cervical adenopathy.  Neurological: He displays tremor.  Skin: Skin is warm and dry. No rash noted.     ED Treatments / Results  Labs (all labs ordered are listed, but only abnormal results are displayed) Labs Reviewed  URINALYSIS, ROUTINE W REFLEX MICROSCOPIC - Abnormal; Notable for the following:       Result Value   Specific Gravity, Urine <1.005 (*)    All other components within normal limits  CBC - Abnormal; Notable for the following:    Platelets 135 (*)    All other components within normal limits  COMPREHENSIVE METABOLIC PANEL - Abnormal; Notable for the following:    Potassium 3.2 (*)    Glucose, Bld 109 (*)    ALT 6 (*)    Total Bilirubin 1.3 (*)    All other components within normal limits    TROPONIN I    EKG  EKG Interpretation  Date/Time:  Friday June 26 2017 15:52:08 EDT Ventricular Rate:  112 PR Interval:    QRS Duration: 110 QT Interval:  318 QTC Calculation: 434 R Axis:   78 Text Interpretation:  Sinus rhythm Interpretation limited secondary to artifact no significant change compared 2016 Confirmed by Pricilla Loveless [phone]) on 06/26/2017 3:58:16 PM       Radiology Dg Chest 2 View  Result Date: 06/26/2017 CLINICAL DATA:  Initial evaluation for acute chest fluttering. EXAM: CHEST  2 VIEW COMPARISON:  Prior radiograph from 11/18/2014. FINDINGS: Median sternotomy wires underlying CABG markers and surgical clips. Mild cardiomegaly, stable. Mediastinal silhouette normal. Aortic atherosclerosis. Lungs normally inflated. No focal infiltrates. No pulmonary edema or pleural effusion. No pneumothorax. No acute osseus  abnormality. IMPRESSION: 1. No active cardiopulmonary disease. 2. Sequelae of prior CABG. Electronically Signed   By: Rise Mu M.D.   On: 06/26/2017 17:50    Procedures Procedures (including critical care time)  Medications Ordered in ED Medications  sodium chloride 0.9 % bolus 500 mL (0 mLs Intravenous Stopped 06/26/17 1701)  potassium chloride SA (K-DUR,KLOR-CON) CR tablet 20 mEq (20 mEq Oral Given 06/26/17 1710)     Initial Impression / Assessment and Plan / ED Course  I have reviewed the triage vital signs and the nursing notes.  Pertinent labs & imaging results that were available during my care of the patient were reviewed by me and considered in my medical decision making (see chart for details).  Clinical Course as of Jun 26 1799  Fri Jun 26, 2017  1654 DG Chest 2 View [AR]  1726 DG Chest 2 View [AR]    Clinical Course User Index [AR] Tillman Sers, DO    83 year old male with PD presents with worsening tremors and tachycardia. No cardiac symptoms. EKG demonstrates sinus tachycardia. Recently stopped his beta blocker at  instruction of cardiologist. UA clear. Troponin negative. Normal CXR. CBC normal and CMP normal except for potassium of 3.2. Given 500 cc NS bolus in ED with reported improvement in abnormal sensation in chest. Given 20 meQ k-dur orally with additional 2 doses to take at home. Instructed to resume beta blocker and follow up with cardiologist. Patient verbalized understanding and agreement with plan.  Final Clinical Impressions(s) / ED Diagnoses   Final diagnoses:  Tachycardia    New Prescriptions Current Discharge Medication List    START taking these medications   Details  Potassium Chloride ER 20 MEQ TBCR Take 20 mEq by mouth daily. Qty: 2 tablet, Refills: 0        Dolores Patty, DO PGY-2, [facility] 06/26/2017 6:00 PM    Tillman Sers, DO 06/26/17 1800    Pricilla Loveless, MD 06/26/17 2300

## 2017-06-26 NOTE — ED Triage Notes (Signed)
Pt's wife sts pt said he was "quivering in his chest"; pt sts this has been going on 3-4 days; also reports insomnia x 2 nights

## 2017-06-26 NOTE — Telephone Encounter (Signed)
Returned call to patient's wife.She stated husband has not been able to sleep for the past 2 nights.Stated he cannot relax,chest feels jittery,pulse elevated in 90's.Stated he feels hot wanting fan on.No chest pain.No sob.Advised he needs to go to ED to be checked.Stated [facility] will [facility].

## 2017-06-26 NOTE — Discharge Instructions (Signed)
°  Please resume your Bystolic. Take potassium supplement for the next 2 days. Please follow up with your cardiologist.

## 2017-06-26 NOTE — Telephone Encounter (Signed)
New message     Pt wife is calling for pt about his pulse.   Patient c/o Palpitations:  High priority if patient c/o lightheadedness and shortness of breath.  1. How long have you been having palpitations? For 5 days.   2. Are you currently experiencing lightheadedness and shortness of breath? Pt wife says that pt states he feels jittery on the inside. At night he can't relax and get to sleep.   3. Have you checked your BP and heart rate? (document readings) HR-93, 100, 97, 97, 90  4. Are you experiencing any other symptoms? No

## 2017-06-29 ENCOUNTER — Other Ambulatory Visit: Payer: Self-pay | Admitting: Cardiology

## 2017-09-22 ENCOUNTER — Ambulatory Visit: Payer: Medicare Other | Admitting: Cardiology

## 2017-11-17 NOTE — Progress Notes (Deleted)
Harry Morgan Date of Birth: Apr 26, 1928 Medical Record #[id number]  History of Present Illness: Harry Morgan is seen today for follow up of CAD. He has known CAD with prior CABG in February 2010. Other issues include HTN and HLD. He is limited by his arthritis and Parkinson's. His last Myoview study in October 2013 was normal. He experienced a syncopal episode in Feb 2016.No arrhythmia noted. BP low. Cranial CT and Echo unremarkable. Requip and losartan HCT held.   He was seen in the ED in September 2018 with tachycardia after stopping Bystolic for low BP. Ecg was interpreted as showing sinus tachy. On my review there is a lot of baseline artifact- cannot rule out atrial tachy/flutter. He was told to resume Bystolic. NEED TO REPEAT ECG  On follow up today he is feeling OK. He denies any chest pain or SOB. No recurrent syncope or dizziness.   He is limited by severe arthritis in his knees. Not felt to be a good surgical candidate due to age and poor functional status. Seen recently by primary care. Noted to have a high PSA and is going to see Urology.  Current Outpatient Medications on File Prior to Visit  Medication Sig Dispense Refill  . Acetaminophen (TYLENOL ARTHRITIS PAIN PO) Take 1 tablet by mouth daily as needed. For pain    . aspirin EC 81 MG tablet Take 1 tablet (81 mg total) by mouth daily. 90 tablet 3  . carbidopa-levodopa (SINEMET) 25-100 MG per tablet Take 1-2 tablets by mouth See admin instructions. Take 1 tablet in the morning, 1 tablets at lunch, 1 at 4pm and 1 tablet at night.    . Cholecalciferol (VITAMIN D PO) Take 1,000 mg by mouth every morning.     . hydrochlorothiazide (HYDRODIURIL) 25 MG tablet TAKE 1 TABLET EVERY DAY 90 tablet 3  . mirtazapine (REMERON) 15 MG tablet TAKE 1 TAB(S) ONCE A DAY (AT BEDTIME) ORALLY  11  . polyethylene glycol powder (GLYCOLAX/MIRALAX) powder Take 0.5 Containers by mouth daily as needed for mild constipation.     . Potassium Chloride ER 20  MEQ TBCR Take 20 mEq by mouth daily. 2 tablet 0  . Psyllium (METAMUCIL PO) Take 5 mLs by mouth at bedtime.    . rosuvastatin (CRESTOR) 10 MG tablet TAKE 1 TABLET ONE TIME DAILY 90 tablet 3  . Zoster Vac Recomb Adjuvanted [facility]) injection Inject 0.5 mLs into the muscle once.     No current facility-administered medications on file prior to visit.     No Known Allergies  Past Medical History:  Diagnosis Date  . Arthritis of spine (HCC)   . Atrial fibrillation (HCC)    Postoperative  . Coronary artery disease 2010   Status post CABG. This included an LIMA graft to the LAD, saphenous vein graft to the diagonal,  saphenous vein graft to the first and second obtuse marginal vessels, saphenous vein graft to the distal right coronary  . Dyslipidemia   . FHx: coronary artery disease   . Hypertension   . Parkinson disease (HCC)   . Renal calculi     Past Surgical History:  Procedure Laterality Date  . CORONARY ARTERY BYPASS GRAFT  11/2008  . HERNIA REPAIR     Status post x3  . KNEE SURGERY     Status post arthroscopic    Social History   Tobacco Use  Smoking Status Former Smoker  Smokeless Tobacco Never Used    Social History   Substance and Sexual  Activity  Alcohol Use No    No family history on file.  Review of Systems: The review of systems is per the HPI.   All other systems were reviewed and are negative.  Physical Exam: There were no vitals taken for this visit. Patient is very pleasant and in no acute distress. He has a resting tremor R>L. Skin is warm and dry. Color is normal.  HEENT is unremarkable. Normocephalic/atraumatic. PERRL. Sclera are nonicteric. Neck is supple. No masses. No JVD. Lungs are clear. Cardiac exam shows a regular rate and rhythm. Normal S1-2. No gallop or murmur. Abdomen is soft. Extremities reveal tr edema. Neurologic exam reveals a tremor. Gait is unsteady. He is using a cane. ROM appears intact. No gross neurologic deficits  noted.   LABORATORY DATA: Labs dated 03/31/17: creatinine 1.2 otherwise CMET normal. Hgb 16. plts 148 K. Cholesterol 103, triglycerides 65, HDL 44, LDL 46. PSA 31,   Ecg today shows NSR with normal Ecg. I have personally reviewed and interpreted this study.   Assessment / Plan:  1.  CAD - prior CABG in 2010. Normal Myoview study October 2013. Remains asymptomatic.  Continue current medical therapy.  2. HTN - blood pressure under good control. No orthostatic symptoms. Continue current dose of Bystolic and HCTZ  3. Parkinson's disease  4. Hyperlipidemia-Cholesterol is excellent on Crestor. Continue therapy  5. Severe osteoarthritis of the knees. Continue conservative therapy.  6. Elevated PSA- per Urology.  I will follow up in 6 months.

## 2017-11-19 ENCOUNTER — Ambulatory Visit: Payer: Medicare Other | Admitting: Cardiology

## 2017-12-10 ENCOUNTER — Other Ambulatory Visit: Payer: Self-pay | Admitting: Cardiology

## 2017-12-18 ENCOUNTER — Encounter: Payer: Self-pay | Admitting: Cardiology

## 2017-12-19 NOTE — Progress Notes (Signed)
Harry Morgan Date of Birth: [birth year] Medical Record #[id number]  History of Present Illness: Harry Morgan is seen today for follow up of CAD. He has known CAD with prior CABG in February 2010. Other issues include HTN and HLD. He is limited by his arthritis and Parkinson's. His last Myoview study in October 2013 was normal. He experienced a syncopal episode in Feb 2016. No arrhythmia noted. BP low. Cranial CT and Echo unremarkable. Requip and losartan HCT held.   He was seen in the ED in September 2018 with tachycardia after stopping Bystolic for low BP. Ecg showed  sinus tachy- but with tremor baseline was difficult to interpret.   On follow up today he is feeling well. He denies any chest pain, dyspnea, palpitations, or dizziness.   He is limited by severe arthritis in his knees. Not felt to be a good surgical candidate due to age and poor functional status. Noted to have a high PSA but Urology noted normal prostate exam and at his age no further work up planned.   Current Outpatient Medications on File Prior to Visit  Medication Sig Dispense Refill  . Acetaminophen (TYLENOL ARTHRITIS PAIN PO) Take 1 tablet by mouth daily as needed. For pain    . aspirin EC 81 MG tablet Take 1 tablet (81 mg total) by mouth daily. 90 tablet 3  . carbidopa-levodopa (SINEMET) 25-100 MG per tablet Take 1-2 tablets by mouth See admin instructions. Take 1 tablet in the morning, 1 tablets at lunch, 1 at 4pm and 1 tablet at night.    . Cholecalciferol (VITAMIN D PO) Take 1,000 mg by mouth every morning.     . hydrochlorothiazide (HYDRODIURIL) 25 MG tablet TAKE 1 TABLET EVERY DAY 90 tablet 3  . mirtazapine (REMERON) 15 MG tablet TAKE 1 TAB(S) ONCE A DAY (AT BEDTIME) ORALLY  11  . polyethylene glycol powder (GLYCOLAX/MIRALAX) powder Take 0.5 Containers by mouth daily as needed for mild constipation.     . Potassium Chloride ER 20 MEQ TBCR Take 20 mEq by mouth daily. 2 tablet 0  . Psyllium (METAMUCIL PO) Take 5  mLs by mouth at bedtime.    . rosuvastatin (CRESTOR) 10 MG tablet TAKE 1 TABLET EVERY DAY 90 tablet 3  . Zoster Vac Recomb Adjuvanted [facility]) injection Inject 0.5 mLs into the muscle once.     No current facility-administered medications on file prior to visit.     No Known Allergies  Past Medical History:  Diagnosis Date  . Arthritis of spine   . Atrial fibrillation (HCC)    Postoperative  . Coronary artery disease 2010   Status post CABG. This included an LIMA graft to the LAD, saphenous vein graft to the diagonal,  saphenous vein graft to the first and second obtuse marginal vessels, saphenous vein graft to the distal right coronary  . Dyslipidemia   . FHx: coronary artery disease   . Hypertension   . Parkinson disease (HCC)   . Renal calculi     Past Surgical History:  Procedure Laterality Date  . CORONARY ARTERY BYPASS GRAFT  11/2008  . HERNIA REPAIR     Status post x3  . KNEE SURGERY     Status post arthroscopic    Social History   Tobacco Use  Smoking Status Former Smoker  Smokeless Tobacco Never Used    Social History   Substance and Sexual Activity  Alcohol Use No    No family history on file.  Review of Systems:  The review of systems is per the HPI.   All other systems were reviewed and are negative.  Physical Exam: There were no vitals taken for this visit. Patient is very pleasant and in no acute distress. He has a resting tremor R>L. Skin is warm and dry. Color is normal.  HEENT is unremarkable. Normocephalic/atraumatic. PERRL. Sclera are nonicteric. Neck is supple. No masses. No JVD. Lungs are clear. Cardiac exam shows a regular rate and rhythm. Normal S1-2. No gallop or murmur. Abdomen is soft. Extremities reveal tr edema. Neurologic exam reveals a tremor. Gait is unsteady. He is using a cane. ROM appears intact. No gross neurologic deficits noted.   LABORATORY DATA: Lab Results  Component Value Date   WBC 5.9 06/26/2017   HGB 15.6  06/26/2017   HCT 45.6 06/26/2017   PLT 135 (L) 06/26/2017   GLUCOSE 109 (H) 06/26/2017   CHOL 105 09/24/2016   TRIG 80 09/24/2016   HDL 45 09/24/2016   LDLCALC 44 09/24/2016   ALT 6 (L) 06/26/2017   AST 19 06/26/2017   NA 139 06/26/2017   K 3.2 (L) 06/26/2017   CL 102 06/26/2017   CREATININE 0.94 06/26/2017   BUN 19 06/26/2017   CO2 28 06/26/2017   TSH 1.873 Test methodology is 3rd generation TSH 12/03/2008   INR 1.5 12/13/2008   HGBA1C  12/06/2008    5.3 (NOTE)   The ADA recommends the following therapeutic goal for glycemic   control related to Hgb A1C measurement:   Goal of Therapy:   < 7.0% Hgb A1C   Reference: American Diabetes Association: Clinical Practice   Recommendations 2008, Diabetes Care,  2008, 31:(Suppl 1).   Labs dated 03/31/17: creatinine 1.2 otherwise CMET normal. Hgb 16. plts 148 K. Cholesterol 103, triglycerides 65, HDL 44, LDL 46. PSA 31,   Ecg today shows NSR with rate 84. Nonspecific ST abnormality. I have personally reviewed and interpreted this study.   Assessment / Plan:  1.  CAD - prior CABG in 2010. Normal Myoview study October 2013. Remains asymptomatic.  Continue current medical therapy.  2. HTN - blood pressure under good control. No hypotension on current therapy. Continue current dose of Bystolic and HCTZ  3. Parkinson's disease  4. Hyperlipidemia-on Crestor.  5. Severe osteoarthritis of the knees. Continue conservative therapy.  I will follow up in 6 months.

## 2017-12-21 ENCOUNTER — Ambulatory Visit (INDEPENDENT_AMBULATORY_CARE_PROVIDER_SITE_OTHER): Payer: Medicare Other | Admitting: Cardiology

## 2017-12-21 ENCOUNTER — Encounter: Payer: Self-pay | Admitting: Cardiology

## 2017-12-21 VITALS — BP 120/74 | HR 84 | Ht 72.0 in | Wt 198.0 lb

## 2017-12-21 DIAGNOSIS — I25708 Atherosclerosis of coronary artery bypass graft(s), unspecified, with other forms of angina pectoris: Secondary | ICD-10-CM | POA: Diagnosis not present

## 2017-12-21 DIAGNOSIS — I10 Essential (primary) hypertension: Secondary | ICD-10-CM | POA: Diagnosis not present

## 2017-12-21 DIAGNOSIS — E785 Hyperlipidemia, unspecified: Secondary | ICD-10-CM

## 2017-12-21 NOTE — Addendum Note (Signed)
Addended by: Neoma LamingPUGH, Joselle Deeds J on: 12/21/2017 01:32 PM   Modules accepted: Orders

## 2017-12-21 NOTE — Patient Instructions (Addendum)
Continue your current therapy  I will see you in 6 months.   

## 2018-05-17 ENCOUNTER — Other Ambulatory Visit: Payer: Self-pay | Admitting: Cardiology

## 2018-06-15 ENCOUNTER — Encounter: Payer: Self-pay | Admitting: *Deleted

## 2018-06-29 NOTE — Progress Notes (Signed)
Harry Morgan Date of Birth: 05-08-1928 Medical Record #[id number]  History of Present Illness: Mr. Nancy NordmannWilliard is seen today for follow up of CAD. He has known CAD with prior CABG in February 2010. Other issues include HTN and HLD. He is limited by his arthritis and Parkinson's. His last Myoview study in October 2013 was normal. He experienced a syncopal episode in Feb 2016. No arrhythmia noted. BP low. Cranial CT and Echo unremarkable. Requip and losartan HCT held.   He was seen in the ED in September 2018 with tachycardia after stopping Bystolic for low BP. Ecg showed  sinus tachy- but with tremor baseline was difficult to interpret.   On follow up today he is doing well from a cardiac perspective. He denies any chest pain, dyspnea, palpitations, or dizziness.   He has severe arthritis in his knees that limits his activity. Noted to have a high PSA but Urology noted normal prostate exam and at his age no further work up planned.   Current Outpatient Medications on File Prior to Visit  Medication Sig Dispense Refill  . Acetaminophen (TYLENOL ARTHRITIS PAIN PO) Take 1 tablet by mouth daily as needed. For pain    . aspirin EC 81 MG tablet Take 1 tablet (81 mg total) by mouth daily. 90 tablet 3  . BYSTOLIC 2.5 MG tablet TAKE 1 TABLET (2.5 MG TOTAL) BY MOUTH DAILY. 90 tablet 0  . carbidopa-levodopa (SINEMET) 25-100 MG per tablet Take 1-2 tablets by mouth See admin instructions. Take 1 tablet in the morning, 1 tablets at lunch, 1 at 4pm and 1 tablet at night.    . Cholecalciferol (VITAMIN D PO) Take 1,000 mg by mouth every morning.     . hydrochlorothiazide (HYDRODIURIL) 25 MG tablet TAKE 1 TABLET EVERY DAY 90 tablet 3  . mirtazapine (REMERON) 15 MG tablet TAKE 1 TAB(S) ONCE A DAY (AT BEDTIME) ORALLY  11  . polyethylene glycol powder (GLYCOLAX/MIRALAX) powder Take 0.5 Containers by mouth daily as needed for mild constipation.     . Potassium Chloride ER 20 MEQ TBCR Take 20 mEq by mouth daily. 2  tablet 0  . Psyllium (METAMUCIL PO) Take 5 mLs by mouth at bedtime.    . rosuvastatin (CRESTOR) 10 MG tablet TAKE 1 TABLET EVERY DAY 90 tablet 3  . sertraline (ZOLOFT) 25 MG tablet Take 25 mg by mouth daily.     No current facility-administered medications on file prior to visit.     No Known Allergies  Past Medical History:  Diagnosis Date  . Arthritis of spine   . Atrial fibrillation (HCC)    Postoperative  . Coronary artery disease 2010   Status post CABG. This included an LIMA graft to the LAD, saphenous vein graft to the diagonal,  saphenous vein graft to the first and second obtuse marginal vessels, saphenous vein graft to the distal right coronary  . Dyslipidemia   . FHx: coronary artery disease   . Hypertension   . Parkinson disease (HCC)   . Renal calculi     Past Surgical History:  Procedure Laterality Date  . CORONARY ARTERY BYPASS GRAFT  11/2008  . HERNIA REPAIR     Status post x3  . KNEE SURGERY     Status post arthroscopic    Social History   Tobacco Use  Smoking Status Former Smoker  Smokeless Tobacco Never Used    Social History   Substance and Sexual Activity  Alcohol Use No    Family History  Problem Relation Age of Onset  . Stroke Mother   . Hypertension Father   . Diabetes Father   . Glaucoma Brother   . Glaucoma Sister     Review of Systems: The review of systems is per the HPI.   All other systems were reviewed and are negative.  Physical Exam: BP 129/85   Pulse 88   Ht 6' (1.829 m)   Wt 192 lb 12.8 oz (87.5 kg)   BMI 26.15 kg/m  GENERAL:  Well appearing WM in NAD HEENT:  PERRL, EOMI, sclera are clear. Oropharynx is clear. NECK:  No jugular venous distention, carotid upstroke brisk and symmetric, no bruits, no thyromegaly or adenopathy LUNGS:  Clear to auscultation bilaterally CHEST:  Unremarkable HEART:  RRR,  PMI not displaced or sustained,S1 and S2 within normal limits, no S3, no S4: no clicks, no rubs, no murmurs ABD:   Soft, nontender. BS +, no masses or bruits. No hepatomegaly, no splenomegaly EXT:  2 + pulses throughout, no edema, no cyanosis no clubbing SKIN:  Warm and dry.  No rashes NEURO:  Alert and oriented x 3. Cranial nerves II through XII intact. Resting tremor.  PSYCH:  Cognitively intact     LABORATORY DATA:  Labs dated 03/31/17: creatinine 1.2 otherwise CMET normal. Hgb 16. plts 148 K. Cholesterol 103, triglycerides 65, HDL 44, LDL 46. PSA 31,   Assessment / Plan:  1.  CAD - prior CABG in 2010. Normal Myoview study October 2013. He is  asymptomatic.  Continue current medical therapy.  2. HTN - blood pressure under good control. Continue low dose Bystolic and HCTZ.  3. Parkinson's disease  4. Hyperlipidemia-on Crestor. Labs followed by primary care.   5. Severe osteoarthritis of the knees. Continue conservative therapy.  I will follow up in 6 months.

## 2018-07-01 ENCOUNTER — Encounter: Payer: Self-pay | Admitting: Cardiology

## 2018-07-01 ENCOUNTER — Ambulatory Visit (INDEPENDENT_AMBULATORY_CARE_PROVIDER_SITE_OTHER): Payer: Medicare Other | Admitting: Cardiology

## 2018-07-01 VITALS — BP 129/85 | HR 88 | Ht 72.0 in | Wt 192.8 lb

## 2018-07-01 DIAGNOSIS — I25708 Atherosclerosis of coronary artery bypass graft(s), unspecified, with other forms of angina pectoris: Secondary | ICD-10-CM

## 2018-07-01 DIAGNOSIS — E785 Hyperlipidemia, unspecified: Secondary | ICD-10-CM | POA: Diagnosis not present

## 2018-07-01 DIAGNOSIS — I10 Essential (primary) hypertension: Secondary | ICD-10-CM

## 2018-07-01 NOTE — Patient Instructions (Signed)
Continue your current therapy  I will see you in 6 months.   

## 2018-07-21 ENCOUNTER — Other Ambulatory Visit: Payer: Self-pay | Admitting: Cardiology

## 2018-08-11 ENCOUNTER — Telehealth: Payer: Self-pay | Admitting: Cardiology

## 2018-08-11 NOTE — Telephone Encounter (Signed)
Received a call from patient.She stated husband's B/P has been elevated the past 2 days.Stated he was complaining of being light headed yesterday.B/P ranging 143/104,141/102.Pulse 83,78.Stated he has not missed taking medications.Message sent to pharmacy for advice.

## 2018-08-11 NOTE — Telephone Encounter (Signed)
Returned call to patient's wife.Belenda Cruise advised to increase bystolic to 5 mg daily.Appointment scheduled with HTN clinic 08/26/18 at 1:30 pm.Advised to bring list of all medications and home B/P machine.

## 2018-08-11 NOTE — Telephone Encounter (Signed)
Have him increase the bystolic to 5 mg daily and make appt for him to see CVRR in 1-2 weeks.  He should bring home cuff in so that we can determine accuracy of home readings

## 2018-08-26 ENCOUNTER — Ambulatory Visit: Payer: Medicare Other

## 2018-08-26 ENCOUNTER — Ambulatory Visit (INDEPENDENT_AMBULATORY_CARE_PROVIDER_SITE_OTHER): Payer: Medicare Other | Admitting: Pharmacist

## 2018-08-26 VITALS — BP 134/80 | HR 62

## 2018-08-26 DIAGNOSIS — I25708 Atherosclerosis of coronary artery bypass graft(s), unspecified, with other forms of angina pectoris: Secondary | ICD-10-CM | POA: Diagnosis not present

## 2018-08-26 DIAGNOSIS — I10 Essential (primary) hypertension: Secondary | ICD-10-CM

## 2018-08-26 NOTE — Patient Instructions (Signed)
Return for a  follow up appointment in as needed  Check your blood pressure at home daily (if able) and keep record of the readings.  Take your BP meds as follows: *NO MEDICATION CHANGES*  Bring all of your meds, your BP cuff and your record of home blood pressures to your next appointment.  Exercise as you're able, try to walk approximately 30 minutes per day.  Keep salt intake to a minimum, especially watch canned and prepared boxed foods.  Eat more fresh fruits and vegetables and fewer canned items.  Avoid eating in fast food restaurants.    HOW TO TAKE YOUR BLOOD PRESSURE: . Rest 5 minutes before taking your blood pressure. .  Don't smoke or drink caffeinated beverages for at least 30 minutes before. . Take your blood pressure before (not after) you eat. . Sit comfortably with your back supported and both feet on the floor (don't cross your legs). . Elevate your arm to heart level on a table or a desk. . Use the proper sized cuff. It should fit smoothly and snugly around your bare upper arm. There should be enough room to slip a fingertip under the cuff. The bottom edge of the cuff should be 1 inch above the crease of the elbow. . Ideally, take 3 measurements at one sitting and record the average.    

## 2018-08-26 NOTE — Progress Notes (Signed)
Patient ID: Harry Morgan                 DOB: 06-Jul-1928                      MRN: [id number]     HPI: Harry Morgan is a 84 y.o. male referred by Dr. SwazilandJordan to HTN clinic. PMH includes hypertension, hyperlipidemia, CAD s/p CABG, Afib, and Parkinson disease. Patient noted few days of elevated BP but denies blurry vision, dizziness, swelling, or headaches. Presents for HTN monitoring and medication management.   Current HTN meds: HCTZ 25mg  daily Bystoli 5mg  daily   BP goal: 130/80 (140/90)  Family History: hypertension and diabetes in father, stroke in mother  Social History: former smoker, denies alcohol use  Exercise: activities of daily lining, ambulate with walker  Home BP readings:  9 readings; average 128/89; HR 70-89bpm Home OMRON arm cuff - unable to calibrate - no batteries  Wt Readings from Last 3 Encounters:  07/01/18 192 lb 12.8 oz (87.5 kg)  12/21/17 198 lb (89.8 kg)  06/26/17 205 lb (93 kg)   BP Readings from Last 3 Encounters:  08/26/18 134/80  07/01/18 129/85  12/21/17 120/74   Pulse Readings from Last 3 Encounters:  08/26/18 62  07/01/18 88  12/21/17 84    Past Medical History:  Diagnosis Date  . Arthritis of spine   . Atrial fibrillation (HCC)    Postoperative  . Coronary artery disease 2010   Status post CABG. This included an LIMA graft to the LAD, saphenous vein graft to the diagonal,  saphenous vein graft to the first and second obtuse marginal vessels, saphenous vein graft to the distal right coronary  . Dyslipidemia   . FHx: coronary artery disease   . Hypertension   . Parkinson disease (HCC)   . Renal calculi     Current Outpatient Medications on File Prior to Visit  Medication Sig Dispense Refill  . Acetaminophen (TYLENOL ARTHRITIS PAIN PO) Take 1 tablet by mouth daily as needed. For pain    . aspirin EC 81 MG tablet Take 1 tablet (81 mg total) by mouth daily. 90 tablet 3  . carbidopa-levodopa (SINEMET) 25-100 MG per tablet Take  1-2 tablets by mouth See admin instructions. Take 1 tablet in the morning, 1 tablets at lunch, 1 at 4pm and 1 tablet at night.    . Cholecalciferol (VITAMIN D PO) Take 1,000 mg by mouth every morning.     . hydrochlorothiazide (HYDRODIURIL) 25 MG tablet TAKE 1 TABLET EVERY DAY 90 tablet 3  . mirtazapine (REMERON) 15 MG tablet TAKE 1 TAB(S) ONCE A DAY (AT BEDTIME) ORALLY  11  . nebivolol (BYSTOLIC) 2.5 MG tablet Take 2 tablets ( 5 mg ) daily 60 tablet 6  . polyethylene glycol powder (GLYCOLAX/MIRALAX) powder Take 0.5 Containers by mouth daily as needed for mild constipation.     . Potassium Chloride ER 20 MEQ TBCR Take 20 mEq by mouth daily. 2 tablet 0  . Psyllium (METAMUCIL PO) Take 5 mLs by mouth at bedtime.    . rosuvastatin (CRESTOR) 10 MG tablet TAKE 1 TABLET EVERY DAY 90 tablet 3  . sertraline (ZOLOFT) 25 MG tablet Take 25 mg by mouth daily.     No current facility-administered medications on file prior to visit.     No Known Allergies  Blood pressure 134/80, pulse 62.  Hypertension Blood pressure is at goal during office visit. Average reading BP reading  at home is 128/89 with few elevated readings during last week. Wife (caregiver) had surgery last week and his diet and medication scheduled was significantly changed for few days. Patient is back to stable regimen for meals and medication.  Will continue current medication therapy without changes and follow up as needed.  [place] Kirchner Rodriguez-Guzman PharmD, BCPS, CPP [facility] [address] [place] [address] 08/29/2018 7:25 PM

## 2018-08-29 ENCOUNTER — Encounter: Payer: Self-pay | Admitting: Pharmacist

## 2018-08-29 NOTE — Assessment & Plan Note (Addendum)
Blood pressure is at goal during office visit. Average reading BP reading at home is 128/89 with few elevated readings during last week. Wife (caregiver) had surgery last week and his diet and medication scheduled was significantly changed for few days. Patient is back to stable regimen for meals and medication.  Will continue current medication therapy without changes and follow up as needed.

## 2018-09-06 ENCOUNTER — Other Ambulatory Visit: Payer: Self-pay | Admitting: Cardiology

## 2018-09-06 ENCOUNTER — Other Ambulatory Visit: Payer: Self-pay

## 2018-09-06 MED ORDER — NEBIVOLOL HCL 5 MG PO TABS: ORAL_TABLET | ORAL | 3 refills | Status: DC

## 2018-09-06 MED ORDER — HYDROCHLOROTHIAZIDE 25 MG PO TABS: 25.0000 mg | ORAL_TABLET | Freq: Every day | ORAL | 3 refills | Status: DC

## 2018-09-06 NOTE — Telephone Encounter (Signed)
°  Spouse is calling to [facility] , states the cost is lower if written for 5 MG.    *STAT* If patient is at the pharmacy, call can be transferred to refill team.   1. Which medications need to be refilled? (please list name of each medication and dose if known) nebivolol (BYSTOLIC)   2. Which pharmacy/location (including street and city if local pharmacy) is medication to be sent to? [facility]  3. Do they need a 30 day or 90 day supply? 90    1. Which medications need to be refilled? (please list name of each medication and dose if known) hydrochlorothiazide (HYDRODIURIL) 25 MG tablet  2. Which pharmacy/location (including street and city if local pharmacy) is medication to be sent to? [facility] [place]  3. Do they need a 30 day or 90 day supply? 10

## 2018-09-17 ENCOUNTER — Telehealth: Payer: Self-pay

## 2018-09-17 ENCOUNTER — Other Ambulatory Visit: Payer: Self-pay

## 2018-09-17 MED ORDER — HYDROCHLOROTHIAZIDE 25 MG PO TABS: 25.0000 mg | ORAL_TABLET | Freq: Every day | ORAL | 3 refills | Status: DC

## 2018-09-17 NOTE — Telephone Encounter (Signed)
Prior authorization for Bystolic completed,waiting for response.Key [id number].

## 2018-09-22 ENCOUNTER — Telehealth: Payer: Self-pay | Admitting: Cardiology

## 2018-09-22 MED ORDER — NEBIVOLOL HCL 5 MG PO TABS: 5.0000 mg | ORAL_TABLET | Freq: Every day | ORAL | 3 refills | Status: DC

## 2018-09-22 NOTE — Telephone Encounter (Signed)
Correct rx sent to pharmacy.

## 2018-09-22 NOTE — Telephone Encounter (Signed)
New Message     *STAT* If patient is at the pharmacy, call can be transferred to refill team.   1. Which medications need to be refilled? (please list name of each medication and dose if known) Bystolic 5mg  once a day   2. Which pharmacy/location (including street and city if local pharmacy) is medication to be sent to? [facility] [place] - [phone]  3. Do they need a 30 day or 90 day supply? 90   Patients wife is calling because the [facility] was [facility] was for 2 a day when the patient is only taking one a day. Therefore a new rx needs to be sent

## 2018-09-23 ENCOUNTER — Telehealth: Payer: Self-pay | Admitting: Cardiology

## 2018-09-23 NOTE — Telephone Encounter (Signed)
Returned call to patient's wife.She stated husband is taking Bystolic 5 mg daily.Stated [facility] 5 mg twice a day.Stated it was denied.Advised I completed prior authorization form yesterday with correction Bystolic 5 mg daily.90 day [facility].Advised to call back if needed.

## 2018-09-23 NOTE — Telephone Encounter (Signed)
New Message:    Please call, concerning pt's Bystolic.

## 2018-12-25 NOTE — Progress Notes (Deleted)
Harry Morgan Date of Birth: [birth year] Medical Record #[id number]  History of Present Illness: Harry Morgan is seen today for follow up of CAD. He has known CAD with prior CABG in February 2010. Other issues include HTN and HLD. He is limited by his arthritis and Parkinson's. His last Myoview study in October 2013 was normal. He experienced a syncopal episode in Feb 2016. No arrhythmia noted. BP low. Cranial CT and Echo unremarkable. Requip and losartan HCT held.   He was seen in the ED in September 2018 with tachycardia after stopping Bystolic for low BP. Ecg showed  sinus tachy- but with tremor baseline was difficult to interpret.   On follow up today he is doing well from a cardiac perspective. He denies any chest pain, dyspnea, palpitations, or dizziness.   He has severe arthritis in his knees that limits his activity. Noted to have a high PSA but Urology noted normal prostate exam and at his age no further work up planned.   Current Outpatient Medications on File Prior to Visit  Medication Sig Dispense Refill  . Acetaminophen (TYLENOL ARTHRITIS PAIN PO) Take 1 tablet by mouth daily as needed. For pain    . aspirin EC 81 MG tablet Take 1 tablet (81 mg total) by mouth daily. 90 tablet 3  . carbidopa-levodopa (SINEMET) 25-100 MG per tablet Take 1-2 tablets by mouth See admin instructions. Take 1 tablet in the morning, 1 tablets at lunch, 1 at 4pm and 1 tablet at night.    . Cholecalciferol (VITAMIN D PO) Take 1,000 mg by mouth every morning.     . hydrochlorothiazide (HYDRODIURIL) 25 MG tablet Take 1 tablet (25 mg total) by mouth daily. 90 tablet 3  . mirtazapine (REMERON) 15 MG tablet TAKE 1 TAB(S) ONCE A DAY (AT BEDTIME) ORALLY  11  . nebivolol (BYSTOLIC) 5 MG tablet Take 1 tablet (5 mg total) by mouth daily. 90 tablet 3  . polyethylene glycol powder (GLYCOLAX/MIRALAX) powder Take 0.5 Containers by mouth daily as needed for mild constipation.     . Potassium Chloride ER 20 MEQ TBCR  Take 20 mEq by mouth daily. 2 tablet 0  . Psyllium (METAMUCIL PO) Take 5 mLs by mouth at bedtime.    . rosuvastatin (CRESTOR) 10 MG tablet TAKE 1 TABLET EVERY DAY 90 tablet 3  . sertraline (ZOLOFT) 25 MG tablet Take 25 mg by mouth daily.     No current facility-administered medications on file prior to visit.     No Known Allergies  Past Medical History:  Diagnosis Date  . Arthritis of spine   . Atrial fibrillation (HCC)    Postoperative  . Coronary artery disease 2010   Status post CABG. This included an LIMA graft to the LAD, saphenous vein graft to the diagonal,  saphenous vein graft to the first and second obtuse marginal vessels, saphenous vein graft to the distal right coronary  . Dyslipidemia   . FHx: coronary artery disease   . Hypertension   . Parkinson disease (HCC)   . Renal calculi     Past Surgical History:  Procedure Laterality Date  . CORONARY ARTERY BYPASS GRAFT  11/2008  . HERNIA REPAIR     Status post x3  . KNEE SURGERY     Status post arthroscopic    Social History   Tobacco Use  Smoking Status Former Smoker  Smokeless Tobacco Never Used    Social History   Substance and Sexual Activity  Alcohol Use No  Family History  Problem Relation Age of Onset  . Stroke Mother   . Hypertension Father   . Diabetes Father   . Glaucoma Brother   . Glaucoma Sister     Review of Systems: The review of systems is per the HPI.   All other systems were reviewed and are negative.  Physical Exam: There were no vitals taken for this visit. GENERAL:  Well appearing WM in NAD HEENT:  PERRL, EOMI, sclera are clear. Oropharynx is clear. NECK:  No jugular venous distention, carotid upstroke brisk and symmetric, no bruits, no thyromegaly or adenopathy LUNGS:  Clear to auscultation bilaterally CHEST:  Unremarkable HEART:  RRR,  PMI not displaced or sustained,S1 and S2 within normal limits, no S3, no S4: no clicks, no rubs, no murmurs ABD:  Soft, nontender. BS  +, no masses or bruits. No hepatomegaly, no splenomegaly EXT:  2 + pulses throughout, no edema, no cyanosis no clubbing SKIN:  Warm and dry.  No rashes NEURO:  Alert and oriented x 3. Cranial nerves II through XII intact. Resting tremor.  PSYCH:  Cognitively intact     LABORATORY DATA:  Labs dated 03/31/17: creatinine 1.2 otherwise CMET normal. Hgb 16. plts 148 K. Cholesterol 103, triglycerides 65, HDL 44, LDL 46. PSA 31,   Assessment / Plan:  1.  CAD - prior CABG in 2010. Normal Myoview study October 2013. He is  asymptomatic.  Continue current medical therapy.  2. HTN - blood pressure under good control. Continue low dose Bystolic and HCTZ.  3. Parkinson's disease  4. Hyperlipidemia-on Crestor. Labs followed by primary care.   5. Severe osteoarthritis of the knees. Continue conservative therapy.  I will follow up in 6 months.

## 2018-12-30 ENCOUNTER — Ambulatory Visit: Payer: Medicare Other | Admitting: Cardiology

## 2019-03-25 ENCOUNTER — Ambulatory Visit: Payer: Medicare Other | Admitting: Cardiology

## 2019-03-31 ENCOUNTER — Other Ambulatory Visit: Payer: Self-pay | Admitting: Cardiology

## 2019-03-31 NOTE — Telephone Encounter (Signed)
Pt calling requesting a refill on rosuvastatin, sent to [facility] mail order pharmacy. Please address

## 2019-04-12 ENCOUNTER — Ambulatory Visit: Payer: Medicare Other | Admitting: Physician Assistant

## 2019-04-28 ENCOUNTER — Telehealth: Payer: Self-pay | Admitting: Physician Assistant

## 2019-04-28 ENCOUNTER — Other Ambulatory Visit: Payer: Self-pay

## 2019-04-28 ENCOUNTER — Ambulatory Visit (INDEPENDENT_AMBULATORY_CARE_PROVIDER_SITE_OTHER): Payer: Medicare Other | Admitting: Physician Assistant

## 2019-04-28 ENCOUNTER — Encounter: Payer: Self-pay | Admitting: Physician Assistant

## 2019-04-28 VITALS — BP 150/82 | HR 85 | Temp 97.7°F | Ht 72.0 in | Wt 189.8 lb

## 2019-04-28 DIAGNOSIS — G20 Parkinson's disease: Secondary | ICD-10-CM | POA: Diagnosis not present

## 2019-04-28 DIAGNOSIS — I10 Essential (primary) hypertension: Secondary | ICD-10-CM | POA: Diagnosis not present

## 2019-04-28 DIAGNOSIS — I25810 Atherosclerosis of coronary artery bypass graft(s) without angina pectoris: Secondary | ICD-10-CM

## 2019-04-28 DIAGNOSIS — E785 Hyperlipidemia, unspecified: Secondary | ICD-10-CM | POA: Diagnosis not present

## 2019-04-28 NOTE — Patient Instructions (Signed)
Medication Instructions:  Your physician recommends that you continue on your current medications as directed. Please refer to the Current Medication list given to you today.  If you need a refill on your cardiac medications before your next appointment, please call your pharmacy.   Lab work: NONE ordered at this time of appointment   Next time your PCP draws labs (blood work) please have then send a copy of the results to our office   If you have labs (blood work) drawn today and your tests are completely normal, you will receive your results only by: Marland Kitchen MyChart Message (if you have MyChart) OR . A paper copy in the mail If you have any lab test that is abnormal or we need to change your treatment, we will call you to review the results.  Testing/Procedures: NONE ordered at this time of appointment   Follow-Up: At [facility], you and your health needs are our priority.  As part of our continuing mission to provide you with exceptional heart care, we have created designated Provider Care Teams.  These Care Teams include your primary Cardiologist (physician) and Advanced Practice Providers (APPs -  Physician Assistants and Nurse Practitioners) who all work together to provide you with the care you need, when you need it. You will need a follow up appointment in 6 months.  Please call our office 2 months in advance to schedule this appointment.  You may see Peter Martinique, MD or one of the following Advanced Practice Providers on your designated Care Team: [place], [place] . Fabian Sharp, PA-C  Any Other Special Instructions Will Be Listed Below (If Applicable).

## 2019-04-28 NOTE — Telephone Encounter (Signed)
I called pt to confirm his appt with Isaac Laud on 04-28-19.       COVID-19 Pre-Screening Questions:   In the past 7 to 10 days have you had a cough,  shortness of breath, headache, congestion, fever (100 or greater) body aches, chills, sore throat, or sudden loss of taste or sense of smell? no  Have you been around anyone with known Covid 19.  Have you been around anyone who is awaiting Covid 19 test results in the past 7 to 10 days? no  Have you been around anyone who has been exposed to Covid 19, or has mentioned symptoms of Covid 19 within the past 7 to 10 days? no  If you have any concerns/questions about symptoms patients report during screening (either on the phone or at [facility]). Contact the provider seeing the patient or DOD for further guidance.  If neither are available contact a member of the leadership team.

## 2019-04-28 NOTE — Progress Notes (Addendum)
Cardiology Office Note    Date:  04/30/2019   ID:  Harry Amberhomas L Sizelove, DOB 1928/02/10, MRN [id number]  PCP:  Pearson ForsterKelly, William S, MD  Cardiologist:  Dr. SwazilandJordan  Chief Complaint  Patient presents with  . Follow-up    seen for Dr. SwazilandJordan.     History of Present Illness:  Harry Morgan is a 85 y.o. male with past medical history of CAD s/p CABG in 11/2008, hyperlipidemia, hypertension, kidney stone and Parkinson's disease.  Last Myoview in October 2013 was normal.  He had a previous syncope in February 2016, however no arrhythmia was documented.  Blood pressure was low.  Head CT and echocardiogram unremarkable.  Requip and losartan were held.  He was seen in the ED in September 2018 for tachycardia after stopping the Bystolic for low BP.  EKG showed a sinus tachycardia but was tremors at baseline that was difficult to interpret.  He was last seen by Dr. SwazilandJordan in September 2019 at which time she was doing well.  Patient presents today to cardiology service accompanied by his wife.  He denies any recent chest pain or shortness of breath.  He does have 2+ lower extremity edema which is likely dependent edema since he does not move around very much.  The only exercise he get is to walk from the bedroom to the bathroom and back.  His lungs is clear on physical exam.  I do not think he need any increased diuretic at this time other than the low-dose hydrochlorothiazide he is already on.  I did advise him to do leg elevation on a as needed basis and cut back on salt.   Past Medical History:  Diagnosis Date  . Arthritis of spine   . Atrial fibrillation (HCC)    Postoperative  . Coronary artery disease 2010   Status post CABG. This included an LIMA graft to the LAD, saphenous vein graft to the diagonal,  saphenous vein graft to the first and second obtuse marginal vessels, saphenous vein graft to the distal right coronary  . Dyslipidemia   . FHx: coronary artery disease   . Hypertension   .  Parkinson disease (HCC)   . Renal calculi     Past Surgical History:  Procedure Laterality Date  . CORONARY ARTERY BYPASS GRAFT  11/2008  . HERNIA REPAIR     Status post x3  . KNEE SURGERY     Status post arthroscopic    Current Medications: Outpatient Medications Prior to Visit  Medication Sig Dispense Refill  . Acetaminophen (TYLENOL ARTHRITIS PAIN PO) Take 1 tablet by mouth daily as needed. For pain    . aspirin EC 81 MG tablet Take 1 tablet (81 mg total) by mouth daily. 90 tablet 3  . carbidopa-levodopa (SINEMET) 25-100 MG per tablet Take 1-2 tablets by mouth See admin instructions. Take 1 tablet in the morning, 1 tablets at lunch, 1 at 4pm and 1 tablet at night.    . Cholecalciferol (VITAMIN D PO) Take 1,000 mg by mouth every morning.     . hydrochlorothiazide (HYDRODIURIL) 25 MG tablet Take 1 tablet (25 mg total) by mouth daily. 90 tablet 3  . mirtazapine (REMERON) 15 MG tablet TAKE 1 TAB(S) ONCE A DAY (AT BEDTIME) ORALLY  11  . nebivolol (BYSTOLIC) 5 MG tablet Take 1 tablet (5 mg total) by mouth daily. 90 tablet 3  . Potassium Chloride ER 20 MEQ TBCR Take 20 mEq by mouth daily. 2 tablet 0  .  Psyllium (METAMUCIL PO) Take 5 mLs by mouth at bedtime.    . rosuvastatin (CRESTOR) 10 MG tablet TAKE 1 TABLET EVERY DAY 90 tablet 1  . sertraline (ZOLOFT) 25 MG tablet Take 25 mg by mouth daily.    . polyethylene glycol powder (GLYCOLAX/MIRALAX) powder Take 0.5 Containers by mouth daily as needed for mild constipation.      No facility-administered medications prior to visit.      Allergies:   Patient has no known allergies.   Social History   Socioeconomic History  . Marital status: Married    Spouse name: Not on file  . Number of children: 1  . Years of education: Not on file  . Highest education level: Not on file  Occupational History  . Occupation: Writer  Social Needs  . Financial resource strain: Not on file  . Food insecurity    Worry: Not on file    Inability:  Not on file  . Transportation needs    Medical: Not on file    Non-medical: Not on file  Tobacco Use  . Smoking status: Former Research scientist (life sciences)  . Smokeless tobacco: Never Used  Substance and Sexual Activity  . Alcohol use: No  . Drug use: No  . Sexual activity: Not Currently  Lifestyle  . Physical activity    Days per week: Not on file    Minutes per session: Not on file  . Stress: Not on file  Relationships  . Social Herbalist on phone: Not on file    Gets together: Not on file    Attends religious service: Not on file    Active member of club or organization: Not on file    Attends meetings of clubs or organizations: Not on file    Relationship status: Not on file  Other Topics Concern  . Not on file  Social History Narrative  . Not on file     Family History:  The patient's family history includes Diabetes in his father; Glaucoma in his brother and sister; Hypertension in his father; Stroke in his mother.   ROS:   Please see the history of present illness.    ROS All other systems reviewed and are negative.   PHYSICAL EXAM:   VS:  BP (!) 150/82   Pulse 85   Temp 97.7 F (36.5 C)   Ht 6' (1.829 m)   Wt 189 lb 12.8 oz (86.1 kg)   SpO2 93%   BMI 25.74 kg/m    GEN: Well nourished, well developed, in no acute distress  HEENT: normal  Neck: no JVD, carotid bruits, or masses Cardiac: RRR; no murmurs, rubs, or gallops. 2+ edema  Respiratory:  clear to auscultation bilaterally, normal work of breathing GI: soft, nontender, nondistended, + BS MS: no deformity or atrophy  Skin: warm and dry, no rash Neuro:  Alert and Oriented x 3, Strength and sensation are intact Psych: euthymic mood, full affect  Wt Readings from Last 3 Encounters:  04/28/19 189 lb 12.8 oz (86.1 kg)  07/01/18 192 lb 12.8 oz (87.5 kg)  12/21/17 198 lb (89.8 kg)      Studies/Labs Reviewed:   EKG:  EKG is ordered today.  The ekg ordered today demonstrates baseline flutter waves due to  Parkinson tremor, not true atrial flutter.  Otherwise sinus rhythm with PVCs nonspecific T wave changes.  Recent Labs: No results found for requested labs within last 8760 hours.   Lipid Panel  Component Value Date/Time   CHOL 105 09/24/2016 1333   TRIG 80 09/24/2016 1333   HDL 45 09/24/2016 1333   CHOLHDL 2.3 09/24/2016 1333   VLDL 16 09/24/2016 1333   LDLCALC 44 09/24/2016 1333    Additional studies/ records that were reviewed today include:   Echo 11/20/2014 LV EF: 60% -  65%   -------------------------------------------------------------------  Indications:   Syncope 780.2.   -------------------------------------------------------------------  History:  PMH:  Coronary artery disease. Congestive heart  failure. Risk factors: Hypertension. Dyslipidemia.   -------------------------------------------------------------------  Study Conclusions   - Left ventricle: The cavity size was normal. Systolic function was  normal. The estimated ejection fraction was in the range of 60%  to 65%. Although no diagnostic regional wall motion abnormality  was identified, this possibility cannot be completely excluded on  the basis of this study. Doppler parameters are consistent with  abnormal left ventricular relaxation (grade 1 diastolic  dysfunction).  - Left atrium: The atrium was mildly dilated.     ASSESSMENT:    1. Coronary artery disease involving coronary bypass graft of native heart without angina pectoris   2. Essential hypertension   3. Hyperlipidemia LDL goal <70   4. Parkinson disease (HCC)      PLAN:  In order of problems listed above:  1. CAD: Denies any recent anginal symptoms.  On aspirin, beta-blocker and statin.  2. Hypertension: Blood pressure elevated today, however normally it is quite well controlled.  I will hold off on adjusting blood pressure medication based on a single elevated number  3. Hyperlipidemia: On Crestor 10 mg  daily  4. Parkinson's disease: Continue carbidopa    Medication Adjustments/Labs and Tests Ordered: Current medicines are reviewed at length with the patient today.  Concerns regarding medicines are outlined above.  Medication changes, Labs and Tests ordered today are listed in the Patient Instructions below. Patient Instructions  Medication Instructions:  Your physician recommends that you continue on your current medications as directed. Please refer to the Current Medication list given to you today.  If you need a refill on your cardiac medications before your next appointment, please call your pharmacy.   Lab work: NONE ordered at this time of appointment   Next time your PCP draws labs (blood work) please have then send a copy of the results to our office   If you have labs (blood work) drawn today and your tests are completely normal, you will receive your results only by: Marland Kitchen. MyChart Message (if you have MyChart) OR . A paper copy in the mail If you have any lab test that is abnormal or we need to change your treatment, we will call you to review the results.  Testing/Procedures: NONE ordered at this time of appointment   Follow-Up: At [facility], you and your health needs are our priority.  As part of our continuing mission to provide you with exceptional heart care, we have created designated Provider Care Teams.  These Care Teams include your primary Cardiologist (physician) and Advanced Practice Providers (APPs -  Physician Assistants and Nurse Practitioners) who all work together to provide you with the care you need, when you need it. You will need a follow up appointment in 6 months.  Please call our office 2 months in advance to schedule this appointment.  You may see Peter SwazilandJordan, MD or one of the following Advanced Practice Providers on your designated Care Team: MiamivilleHao Taren Toops, [place]PA-C . Micah FlesherAngela Duke, PA-C  Any Other Special Instructions Will  Be Listed Below (If  Applicable).       Ramond DialSigned, Colan Laymon, [place]  04/30/2019 11:57 PM    [facility] [address], [place], [place]  [address] Phone: [phone]; Fax: [phone]

## 2019-04-28 NOTE — Telephone Encounter (Signed)
New Message:     Pt's wife says she have to come in the ofiiice with pt for his appt today. She says pt has Parkinson, she have to talk for him.

## 2019-04-30 ENCOUNTER — Encounter: Payer: Self-pay | Admitting: Physician Assistant

## 2019-05-15 ENCOUNTER — Encounter

## 2019-07-07 ENCOUNTER — Other Ambulatory Visit: Payer: Self-pay

## 2019-07-07 ENCOUNTER — Telehealth: Payer: Self-pay | Admitting: Cardiology

## 2019-07-07 MED ORDER — ROSUVASTATIN CALCIUM 10 MG PO TABS: 10.0000 mg | ORAL_TABLET | Freq: Every day | ORAL | 1 refills | Status: DC

## 2019-07-07 NOTE — Telephone Encounter (Signed)
° ° ° °  Patient is out of medication   1. Which medications need to be refilled? (please list name of each medication and dose if known) rosuvastatin (CRESTOR) 10 MG tablet  2. Which pharmacy/location (including street and city if local pharmacy) is medication to be sent to? [facility] [place]  3. Do they need a 30 day or 90 day supply? [place]

## 2019-07-11 IMAGING — DX DG CHEST 2V
2 series · 2 of 2 positions shown · non-contrast
Comparison: Prior radiograph from 11/18/2014.

CLINICAL DATA: Initial evaluation for acute chest fluttering.

EXAM:
CHEST  2 VIEW

[chest lat]
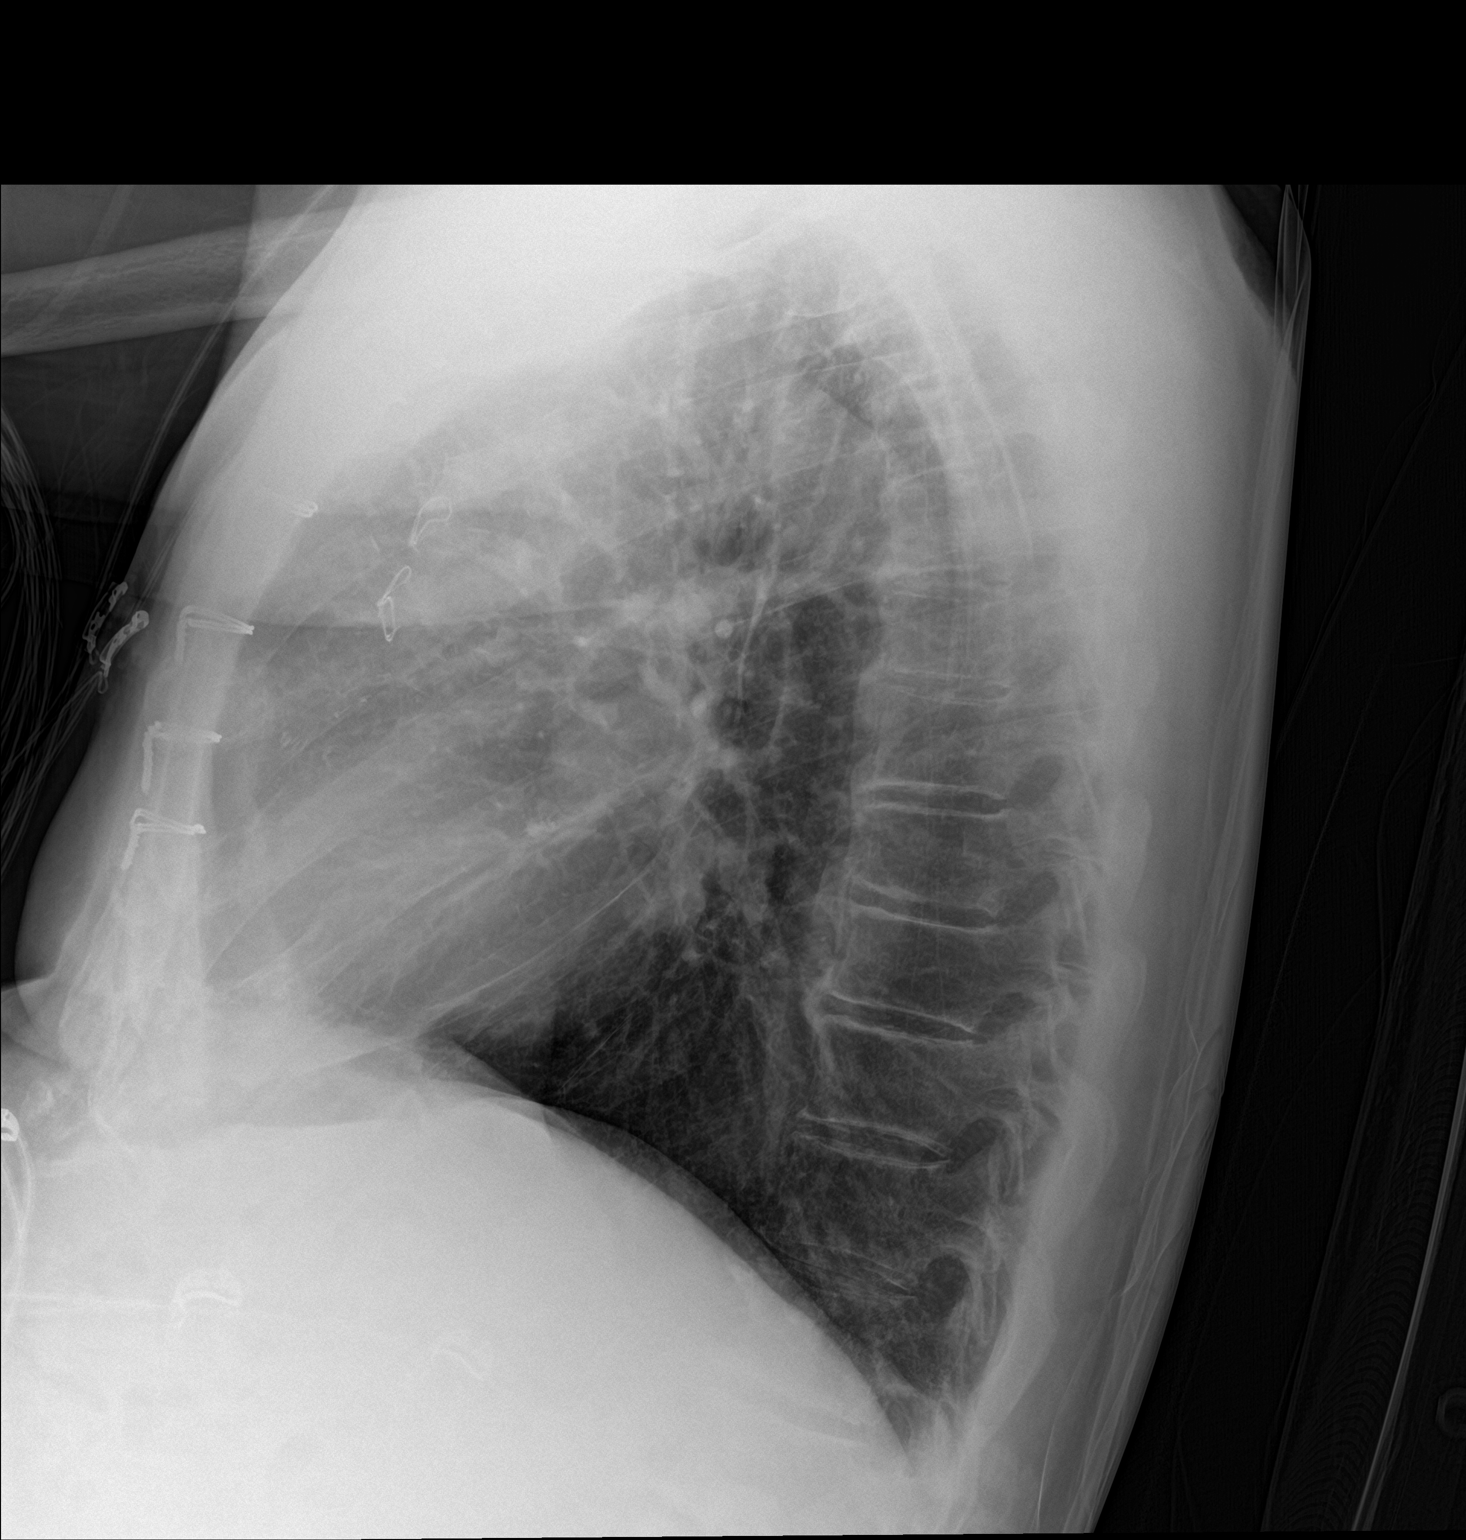

[chest ap strecther]
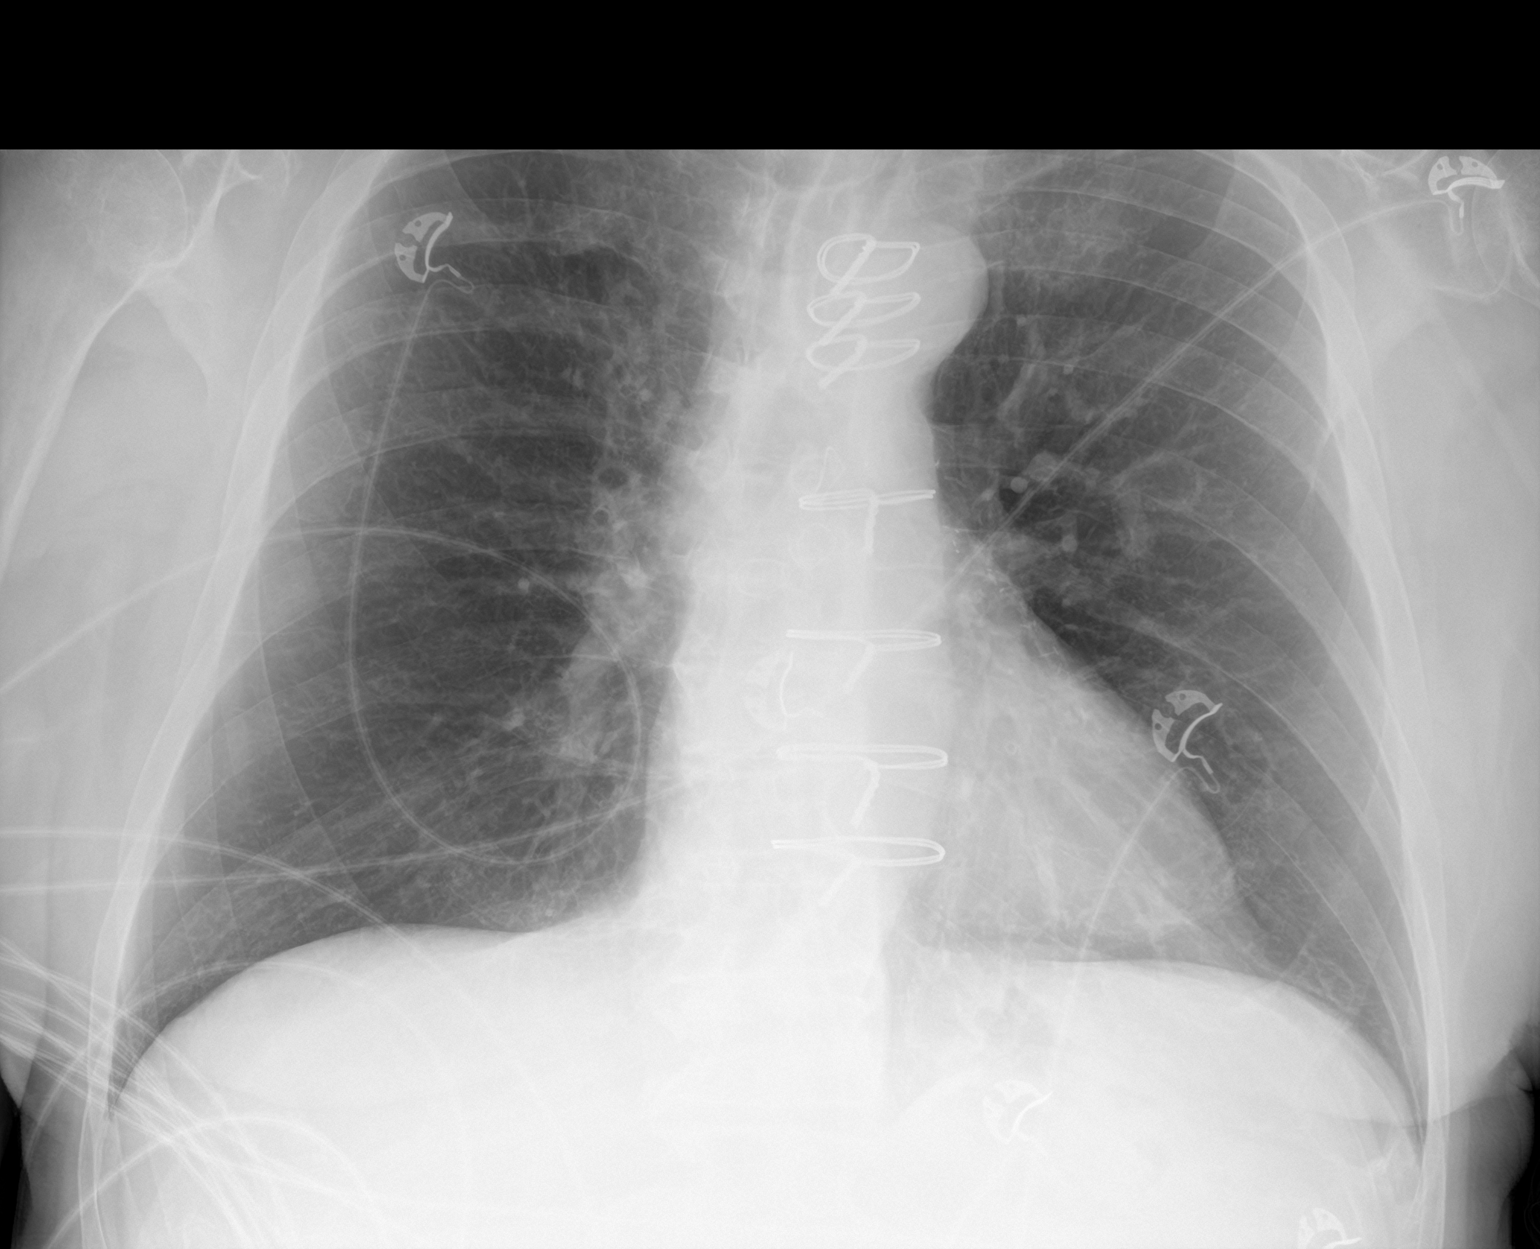

[2 of 2 positions shown; findings below may reference images not displayed]

FINDINGS: Median sternotomy wires underlying CABG markers and surgical clips.
Mild cardiomegaly, stable. Mediastinal silhouette normal. Aortic
atherosclerosis.

Lungs normally inflated. No focal infiltrates. No pulmonary edema or
pleural effusion. No pneumothorax.

No acute osseus abnormality.
IMPRESSION: 1. No active cardiopulmonary disease.
2. Sequelae of prior CABG.

## 2019-08-22 ENCOUNTER — Other Ambulatory Visit: Payer: Self-pay | Admitting: Cardiology

## 2019-08-25 ENCOUNTER — Other Ambulatory Visit: Payer: Self-pay | Admitting: Cardiology

## 2019-11-09 NOTE — Progress Notes (Signed)
Harry Morgan Date of Birth: 1927/11/23 Medical Record #[id number]  History of Present Illness: Mr. Dubray is seen today for follow up of CAD. He has known CAD with prior CABG in February 2010. Other issues include HTN and HLD. He is limited by his arthritis and Parkinson's. His last Myoview study in October 2013 was normal. He experienced a syncopal episode in Feb 2016. No arrhythmia noted. BP low. Cranial CT and Echo unremarkable. Requip and losartan HCT held.   He was seen in the ED in September 2018 with tachycardia after stopping Bystolic for low BP. Ecg showed  sinus tachy- but with tremor baseline was difficult to interpret.   On follow up today he is doing well from a cardiac standpoint. He denies any chest pain, dyspnea, palpitations, or dizziness.   He is seen in a wheelchair which he uses due to severe arthritis.   Current Outpatient Medications on File Prior to Visit  Medication Sig Dispense Refill  . Acetaminophen (TYLENOL ARTHRITIS PAIN PO) Take 1 tablet by mouth daily as needed. For pain    . aspirin EC 81 MG tablet Take 1 tablet (81 mg total) by mouth daily. 90 tablet 3  . BYSTOLIC 5 MG tablet TAKE 1 TABLET (5 MG TOTAL) BY MOUTH DAILY. 90 tablet 3  . carbidopa-levodopa (SINEMET) 25-100 MG per tablet Take 1-2 tablets by mouth See admin instructions. Take 1 tablet in the morning, 1 tablets at lunch, 1 at 4pm and 1 tablet at night.    . Cholecalciferol (VITAMIN D PO) Take 1,000 mg by mouth every morning.     . hydrochlorothiazide (HYDRODIURIL) 25 MG tablet TAKE 1 TABLET (25 MG TOTAL) BY MOUTH DAILY. 90 tablet 3  . mirtazapine (REMERON) 15 MG tablet TAKE 1 TAB(S) ONCE A DAY (AT BEDTIME) ORALLY  11  . Potassium Chloride ER 20 MEQ TBCR Take 20 mEq by mouth daily. 2 tablet 0  . Psyllium (METAMUCIL PO) Take 5 mLs by mouth at bedtime.    . rosuvastatin (CRESTOR) 10 MG tablet TAKE 1 TABLET EVERY DAY 90 tablet 1  . sertraline (ZOLOFT) 25 MG tablet Take 25 mg by mouth daily.      No current facility-administered medications on file prior to visit.    No Known Allergies  Past Medical History:  Diagnosis Date  . Arthritis of spine   . Atrial fibrillation (HCC)    Postoperative  . Coronary artery disease 2010   Status post CABG. This included an LIMA graft to the LAD, saphenous vein graft to the diagonal,  saphenous vein graft to the first and second obtuse marginal vessels, saphenous vein graft to the distal right coronary  . Dyslipidemia   . FHx: coronary artery disease   . Hypertension   . Parkinson disease ([place])   . Renal calculi     Past Surgical History:  Procedure Laterality Date  . CORONARY ARTERY BYPASS GRAFT  11/2008  . HERNIA REPAIR     Status post x3  . KNEE SURGERY     Status post arthroscopic    Social History   Tobacco Use  Smoking Status Former Smoker  Smokeless Tobacco Never Used    Social History   Substance and Sexual Activity  Alcohol Use No    Family History  Problem Relation Age of Onset  . Stroke Mother   . Hypertension Father   . Diabetes Father   . Glaucoma Brother   . Glaucoma Sister     Review of Systems:  The review of systems is per the HPI.   All other systems were reviewed and are negative.  Physical Exam: BP 136/89   Pulse 79   Ht 6' (1.829 m)   Wt 187 lb 3.2 oz (84.9 kg)   BMI 25.39 kg/m  GENERAL:  Well appearing WM in NAD HEENT:  PERRL, EOMI, sclera are clear. Oropharynx is clear. NECK:  No jugular venous distention, carotid upstroke brisk and symmetric, no bruits, no thyromegaly or adenopathy LUNGS:  Clear to auscultation bilaterally CHEST:  Unremarkable HEART:  RRR,  PMI not displaced or sustained,S1 and S2 within normal limits, no S3, no S4: no clicks, no rubs, no murmurs ABD:  Soft, nontender. BS +, no masses or bruits. No hepatomegaly, no splenomegaly EXT:  2 + pulses throughout, no edema, no cyanosis no clubbing SKIN:  Warm and dry.  No rashes NEURO:  Alert and oriented x 3. Cranial  nerves II through XII intact. Resting tremor.  PSYCH:  Cognitively intact     LABORATORY DATA:  Labs dated 03/31/17: creatinine 1.2 otherwise CMET normal. Hgb 16. plts 148 K. Cholesterol 103, triglycerides 65, HDL 44, LDL 46. PSA 31,   Assessment / Plan:  1.  CAD - prior CABG in 2010. Normal Myoview study October 2013. He remains  asymptomatic.  Continue current medical therapy with ASA, statin, Bystolic.  2. HTN - blood pressure under good control. Continue low dose Bystolic and HCTZ.  3. Parkinson's disease- per Neurology  4. Hyperlipidemia-on Crestor. Will update labs today including CMET, CBC, lipid panel.  5. Severe osteoarthritis of the knees. Continue conservative therapy.  I will follow up in 6 months.

## 2019-11-18 ENCOUNTER — Ambulatory Visit (INDEPENDENT_AMBULATORY_CARE_PROVIDER_SITE_OTHER): Payer: Medicare Other | Admitting: Cardiology

## 2019-11-18 ENCOUNTER — Encounter (INDEPENDENT_AMBULATORY_CARE_PROVIDER_SITE_OTHER): Payer: Self-pay

## 2019-11-18 ENCOUNTER — Other Ambulatory Visit: Payer: Self-pay

## 2019-11-18 ENCOUNTER — Encounter: Payer: Self-pay | Admitting: Cardiology

## 2019-11-18 VITALS — BP 136/89 | HR 79 | Ht 72.0 in | Wt 187.2 lb

## 2019-11-18 DIAGNOSIS — I25810 Atherosclerosis of coronary artery bypass graft(s) without angina pectoris: Secondary | ICD-10-CM

## 2019-11-18 DIAGNOSIS — E785 Hyperlipidemia, unspecified: Secondary | ICD-10-CM | POA: Diagnosis not present

## 2019-11-18 DIAGNOSIS — I10 Essential (primary) hypertension: Secondary | ICD-10-CM

## 2019-11-23 LAB — CBC WITH DIFFERENTIAL/PLATELET

## 2019-11-23 LAB — BASIC METABOLIC PANEL
BUN/Creatinine Ratio: 21 (ref 10–24)
BUN: 23 mg/dL (ref 8–27)
CO2: 27 mmol/L (ref 20–29)
Calcium: 10 mg/dL (ref 8.6–10.2)
Chloride: 98 mmol/L (ref 96–106)
Creatinine, Ser: 1.09 mg/dL (ref 0.76–1.27)
GFR calc Af Amer: 69 mL/min/{1.73_m2} (ref >59)
GFR calc non Af Amer: 60 mL/min/{1.73_m2} (ref >59)
Glucose: 85 mg/dL (ref 65–99)
Potassium: 4 mmol/L (ref 3.5–5.2)
Sodium: 140 mmol/L (ref 134–144)

## 2019-11-23 LAB — HEPATIC FUNCTION PANEL
ALT: 6 IU/L (ref 0–44)
AST: 17 IU/L (ref 0–40)
Albumin: 4.4 g/dL (ref 3.6–4.6)
Alkaline Phosphatase: 80 IU/L (ref 39–117)
Bilirubin Total: 0.9 mg/dL (ref 0.0–1.2)
Bilirubin, Direct: 0.27 mg/dL (ref 0.00–0.40)
Total Protein: 7.1 g/dL (ref 6.0–8.5)

## 2019-11-23 LAB — LIPID PANEL
Chol/HDL Ratio: 2.3 ratio (ref 0.0–5.0)
Cholesterol, Total: 117 mg/dL (ref 100–199)
HDL: 50 mg/dL (ref >39)
LDL Chol Calc (NIH): 51 mg/dL (ref 0–99)
Triglycerides: 80 mg/dL (ref 0–149)
VLDL Cholesterol Cal: 16 mg/dL (ref 5–40)

## 2020-01-19 ENCOUNTER — Telehealth: Payer: Self-pay | Admitting: Cardiology

## 2020-01-19 NOTE — Telephone Encounter (Signed)
Pt c/o BP issue: STAT if pt c/o blurred vision, one-sided weakness or slurred speech  1. What are your last 5 BP readings?  97/67 108/60 108/60 102/60 110/60 130/60 3/17 120/60 3/15  2. Are you having any other symptoms (ex. Dizziness, headache, blurred vision, passed out) NO   3. What is your BP issue? Wife wants to know if maybe his BP is running a little too low.  Wife states he has had a urinary cath for the past 5 weeks.

## 2020-01-19 NOTE — Telephone Encounter (Signed)
I would have him stop HCTZ and monitor. Could also hold potassium  Yakima Kreitzer Swaziland MD, [facility]

## 2020-01-19 NOTE — Telephone Encounter (Signed)
Called patient no answer.LMTC. 

## 2020-01-19 NOTE — Telephone Encounter (Addendum)
Pts wife called to report the pts BP readings..   He has had a TIA 11/27/19 and not long after he was Dx with a kidney infection...he has had trouble with urinary retention since then and now has an indwelling foley catheter the past several days.   He has not been eating well and his BP has been getting low. He is not lethargic but has been struggling with strength due to his Parkinson's so she is not sure if he is having sx of low BP.   I have advised her to talk with the Urologist about the lower BP readings and possible dehydration... I will also forward to Dr. Swaziland for review to see if he would like to make any med changes and any further recommendations.   What are your last 5 BP readings?  97/67 108/60 108/60 102/60 110/60 130/60 3/17 120/60 3/15

## 2020-01-20 NOTE — Telephone Encounter (Signed)
Received a call from patient's wife.Dr.Jordan's advice given.Stated husband has not been taking potassium.Advised to monitor B/P and call back if not better.

## 2020-01-26 ENCOUNTER — Telehealth: Payer: Self-pay

## 2020-01-26 DIAGNOSIS — R600 Localized edema: Secondary | ICD-10-CM

## 2020-01-26 DIAGNOSIS — I25810 Atherosclerosis of coronary artery bypass graft(s) without angina pectoris: Secondary | ICD-10-CM

## 2020-01-26 DIAGNOSIS — I10 Essential (primary) hypertension: Secondary | ICD-10-CM

## 2020-01-26 NOTE — Telephone Encounter (Signed)
Received a call from patient's wife this morning.She stated since husband stopped taking HCTZ he has increased swelling in both feet and ankles.Spoke to Dr.Jordan he advised ok to restart HCTZ 25 mg daily.Advised to have a bmet in 2 weeks.Advised to call back if swelling does not improve.

## 2020-02-06 ENCOUNTER — Telehealth: Payer: Self-pay | Admitting: Cardiology

## 2020-02-06 NOTE — Telephone Encounter (Signed)
Patient's wife states she is requesting to speak with Harry Morgan in regards to patient having blood work completed on 02/10/20. Please call to discuss.

## 2020-02-06 NOTE — Telephone Encounter (Signed)
The patient's wife called asking if the patient could have him BMET done at any [facility]. She has been advised that this was fine.

## 2020-02-11 LAB — BASIC METABOLIC PANEL
BUN/Creatinine Ratio: 10 (ref 10–24)
BUN: 10 mg/dL (ref 8–27)
CO2: 26 mmol/L (ref 20–29)
Calcium: 9.4 mg/dL (ref 8.6–10.2)
Chloride: 98 mmol/L (ref 96–106)
Creatinine, Ser: 0.97 mg/dL (ref 0.76–1.27)
GFR calc Af Amer: 80 mL/min/{1.73_m2} (ref >59)
GFR calc non Af Amer: 69 mL/min/{1.73_m2} (ref >59)
Glucose: 100 mg/dL — ABNORMAL HIGH (ref 65–99)
Potassium: 3.8 mmol/L (ref 3.5–5.2)
Sodium: 138 mmol/L (ref 134–144)

## 2020-03-05 ENCOUNTER — Other Ambulatory Visit: Payer: Self-pay | Admitting: Cardiology

## 2020-03-05 NOTE — Telephone Encounter (Signed)
Rx request sent to pharmacy.  

## 2020-05-08 ENCOUNTER — Telehealth: Payer: Self-pay | Admitting: Cardiology

## 2020-05-08 NOTE — Telephone Encounter (Signed)
LVM for patient to return call to get scheduled with Jordan from recall list 

## 2020-05-25 NOTE — Progress Notes (Signed)
[name] Date of Birth: 31-Jul-1928 Medical Record #[id number]  History of Present Illness: [name] is seen today for follow up of CAD. He has known CAD with prior CABG in February 2010. Other issues include HTN and HLD. He is limited by his arthritis and Parkinson's. His last Myoview study in October 2013 was normal. He experienced a syncopal episode in Feb 2016. No arrhythmia noted. BP low. Cranial CT and Echo unremarkable. Requip and losartan HCT held.   On follow up today he is doing well from a cardiac standpoint. He denies any chest pain, dyspnea, palpitations, or dizziness.   He has  severe arthritis.  In February he was seen in the ED after an episode of aphasia. This lasted less than 5 minutes and resolved. Seen in ED and CT head was negative. Labs ok. Sent home on ASA. He has developed urinary retention. Followed by [facility] urology. No improvement with Flomax and Proscar. Indwelling foley in place. Had C. Difficile colitis in April. Has difficult time walking due to arthritis and Parkinson's disease. Wife is primary caregiver.  Current Outpatient Medications on File Prior to Visit  Medication Sig Dispense Refill   Acetaminophen (TYLENOL ARTHRITIS PAIN PO) Take 1 tablet by mouth daily as needed. For pain     aspirin EC 81 MG tablet Take 1 tablet (81 mg total) by mouth daily. 90 tablet 3   BYSTOLIC 5 MG tablet TAKE 1 TABLET (5 MG TOTAL) BY MOUTH DAILY. 90 tablet 3   carbidopa-levodopa (SINEMET) 25-100 MG per tablet Take 1-2 tablets by mouth See admin instructions. Take 1 tablet in the morning, 1 tablets at lunch, 1 at 4pm and 1 tablet at night.     Cholecalciferol (VITAMIN D PO) Take 1,000 mg by mouth every morning.      docusate sodium (COLACE) 250 MG capsule Take 250 mg by mouth daily.     finasteride (PROSCAR) 5 MG tablet Take by mouth.     hydrochlorothiazide (HYDRODIURIL) 25 MG tablet Take 1 tablet (25 mg total) by mouth daily. 90 tablet 3   mirtazapine  (REMERON) 15 MG tablet TAKE 1 TAB(S) ONCE A DAY (AT BEDTIME) ORALLY  11   Psyllium (METAMUCIL PO) Take 5 mLs by mouth at bedtime.     rosuvastatin (CRESTOR) 10 MG tablet TAKE 1 TABLET EVERY DAY 90 tablet 1   sertraline (ZOLOFT) 25 MG tablet Take 25 mg by mouth daily.     tamsulosin (FLOMAX) 0.4 MG CAPS capsule Take by mouth.     No current facility-administered medications on file prior to visit.    No Known Allergies  Past Medical History:  Diagnosis Date   Arthritis of spine    Atrial fibrillation (HCC)    Postoperative   Coronary artery disease 2010   Status post CABG. This included an LIMA graft to the LAD, saphenous vein graft to the diagonal,  saphenous vein graft to the first and second obtuse marginal vessels, saphenous vein graft to the distal right coronary   Dyslipidemia    FHx: coronary artery disease    Hypertension    Parkinson disease (HCC)    Renal calculi     Past Surgical History:  Procedure Laterality Date   CORONARY ARTERY BYPASS GRAFT  11/2008   HERNIA REPAIR     Status post x3   KNEE SURGERY     Status post arthroscopic    Social History   Tobacco Use  Smoking Status Former Smoker  Smokeless Tobacco Never  Used    Social History   Substance and Sexual Activity  Alcohol Use No    Family History  Problem Relation Age of Onset   Stroke Mother    Hypertension Father    Diabetes Father    Glaucoma Brother    Glaucoma Sister     Review of Systems: The review of systems is per the HPI.   All other systems were reviewed and are negative.  Physical Exam: BP (!) 150/86    Pulse 84    Ht 6' (1.829 m)    Wt 177 lb 3.2 oz (80.4 kg)    SpO2 100%    BMI 24.03 kg/m  GENERAL:  Elderly  WM in NAD HEENT:  PERRL, EOMI, sclera are clear. Oropharynx is clear. HOH NECK:  No jugular venous distention, carotid upstroke brisk and symmetric, no bruits, no thyromegaly or adenopathy LUNGS:  Clear to auscultation bilaterally CHEST:   Unremarkable HEART:  RRR,  PMI not displaced or sustained,S1 and S2 within normal limits, no S3, no S4: no clicks, no rubs, no murmurs ABD:  Soft, nontender. BS +, no masses or bruits. No hepatomegaly, no splenomegaly EXT:  2 + pulses throughout, no edema, no cyanosis no clubbing SKIN:  Warm and dry.  No rashes NEURO:  Alert and oriented x 3. Cranial nerves II through XII intact. Resting tremor.  PSYCH:  Cognitively intact     LABORATORY DATA: Lab Results  Component Value Date   WBC CANCELED 11/18/2019   HGB CANCELED 11/18/2019   HCT CANCELED 11/18/2019   PLT CANCELED 11/18/2019   GLUCOSE 100 (H) 02/10/2020   CHOL 117 11/18/2019   TRIG 80 11/18/2019   HDL 50 11/18/2019   LDLCALC 51 11/18/2019   ALT 6 11/18/2019   AST 17 11/18/2019   NA 138 02/10/2020   K 3.8 02/10/2020   CL 98 02/10/2020   CREATININE 0.97 02/10/2020   BUN 10 02/10/2020   CO2 26 02/10/2020   TSH     1.873  12/03/2008   INR 1.5 12/13/2008   HGBA1C  12/06/2008    5.3 (NOTE)   The ADA recommends the following therapeutic goal for glycemic   control related to Hgb A1C measurement:   Goal of Therapy:   < 7.0% Hgb A1C   Reference: American Diabetes Association: Clinical Practice   Recommendations 2008, Diabetes Care,  2008, 31:(Suppl 1).      Labs dated 03/31/17: creatinine 1.2 otherwise CMET normal. Hgb 16. plts 148 K. Cholesterol 103, triglycerides 65, HDL 44, LDL 46. PSA 31,   Ecg today shows NSR  With baseline artifact due to tremor. Rate 84. Otherwise normal. I have personally reviewed and interpreted this study.   Assessment / Plan:  1.  CAD - prior CABG in 2010. Normal Myoview study October 2013. He remains  asymptomatic. Activity is very limited.  Continue current medical therapy with ASA, statin, Bystolic.  2. HTN - blood pressure under good control. Continue low dose Bystolic and HCTZ.  3. Parkinson's disease- per Neurology  4. Hyperlipidemia-on Crestor. Labs in February satisfactory.   5.  Severe osteoarthritis of the knees. Continue conservative therapy.  6. Urinary retention. Has foley in place. Follow up with urology. If surgery procedure contemplated I think he is high risk for anesthesia due to advanced age, CAD and poor functional status.   I will follow up in 6 months.

## 2020-05-28 ENCOUNTER — Other Ambulatory Visit: Payer: Self-pay

## 2020-05-28 ENCOUNTER — Ambulatory Visit (INDEPENDENT_AMBULATORY_CARE_PROVIDER_SITE_OTHER): Payer: Medicare Other | Admitting: Cardiology

## 2020-05-28 ENCOUNTER — Encounter: Payer: Self-pay | Admitting: Cardiology

## 2020-05-28 VITALS — BP 150/86 | HR 84 | Ht 72.0 in | Wt 177.2 lb

## 2020-05-28 DIAGNOSIS — G20 Parkinson's disease: Secondary | ICD-10-CM

## 2020-05-28 DIAGNOSIS — E785 Hyperlipidemia, unspecified: Secondary | ICD-10-CM | POA: Diagnosis not present

## 2020-05-28 DIAGNOSIS — I10 Essential (primary) hypertension: Secondary | ICD-10-CM | POA: Diagnosis not present

## 2020-05-28 DIAGNOSIS — I25810 Atherosclerosis of coronary artery bypass graft(s) without angina pectoris: Secondary | ICD-10-CM

## 2020-05-28 DIAGNOSIS — R600 Localized edema: Secondary | ICD-10-CM

## 2020-08-23 ENCOUNTER — Other Ambulatory Visit: Payer: Self-pay | Admitting: Cardiology

## 2020-09-10 ENCOUNTER — Telehealth: Payer: Self-pay | Admitting: Cardiology

## 2020-09-10 NOTE — Telephone Encounter (Signed)
Okay to change to generic nebivolol 5mg  daily.

## 2020-09-10 NOTE — Telephone Encounter (Signed)
Called and made patients spouse aware of the following:  Rx for nevibolol already sent on 08/23/2020 to [facility]    She verbalized understanding and will call back with any new concerns.

## 2020-09-10 NOTE — Telephone Encounter (Signed)
Pt c/o medication issue:  1. Name of Medication: nebivolol (BYSTOLIC) 5 MG tablet [[id number]]    2. How are you currently taking this medication (dosage and times per day)? TAKE 1 TABLET EVERY DAY  3. Are you having a reaction (difficulty breathing--STAT)? No   4. What is your medication issue? Pts wife called in and stated that [facility] would not pay for the name brand of the Bystolic.  They will only cover the nebivolol  Best number [phone]

## 2020-12-09 NOTE — Progress Notes (Signed)
[name] Date of Birth: [birth year] Medical Record #[id number]  History of Present Illness: [name] is seen today for follow up of CAD. He has known CAD with prior CABG in February 2010. Other issues include HTN and HLD. He is limited by his arthritis and Parkinson's. His last Myoview study in October 2013 was normal. He experienced a syncopal episode in Feb 2016. No arrhythmia noted. BP low. Cranial CT and Echo unremarkable. Requip and losartan HCT held.   On follow up today he is doing well from a cardiac standpoint. He denies any chest pain, dyspnea, palpitations, or dizziness.   He has  severe arthritis.   He has urinary retention. Followed by [facility] urology. No improvement with Flomax and Proscar. Indwelling foley in place. His wife is concerned it isn't draining.  Has difficult time walking due to arthritis and Parkinson's disease. Wife is primary caregiver. He denies any chest pain or dyspnea.   Current Outpatient Medications on File Prior to Visit  Medication Sig Dispense Refill  . Acetaminophen (TYLENOL ARTHRITIS PAIN PO) Take 1 tablet by mouth daily as needed. For pain    . aspirin EC 81 MG tablet Take 1 tablet (81 mg total) by mouth daily. 90 tablet 3  . carbidopa-levodopa (SINEMET) 25-100 MG per tablet Take 1-2 tablets by mouth See admin instructions. Take 1 tablet in the morning, 1 tablets at lunch, 1 at 4pm and 1 tablet at night.    . Cholecalciferol (VITAMIN D PO) Take 1,000 mg by mouth every morning.     . docusate sodium (COLACE) 250 MG capsule Take 250 mg by mouth daily.    . finasteride (PROSCAR) 5 MG tablet Take by mouth.    . hydrochlorothiazide (HYDRODIURIL) 25 MG tablet TAKE 1 TABLET EVERY DAY 90 tablet 3  . mirtazapine (REMERON) 15 MG tablet TAKE 1 TAB(S) ONCE A DAY (AT BEDTIME) ORALLY  11  . nebivolol (BYSTOLIC) 5 MG tablet TAKE 1 TABLET EVERY DAY 90 tablet 3  . polyethylene glycol (MIRALAX / GLYCOLAX) 17 g packet Take 17 g by mouth daily.    .  Psyllium (METAMUCIL PO) Take 5 mLs by mouth at bedtime.    . rosuvastatin (CRESTOR) 10 MG tablet TAKE 1 TABLET EVERY DAY 90 tablet 1  . sertraline (ZOLOFT) 25 MG tablet Take 25 mg by mouth daily.     No current facility-administered medications on file prior to visit.    No Known Allergies  Past Medical History:  Diagnosis Date  . Arthritis of spine   . Atrial fibrillation (HCC)    Postoperative  . Coronary artery disease 2010   Status post CABG. This included an LIMA graft to the LAD, saphenous vein graft to the diagonal,  saphenous vein graft to the first and second obtuse marginal vessels, saphenous vein graft to the distal right coronary  . Dyslipidemia   . FHx: coronary artery disease   . Hypertension   . Parkinson disease (HCC)   . Renal calculi     Past Surgical History:  Procedure Laterality Date  . CORONARY ARTERY BYPASS GRAFT  11/2008  . HERNIA REPAIR     Status post x3  . KNEE SURGERY     Status post arthroscopic    Social History   Tobacco Use  Smoking Status Former Smoker  Smokeless Tobacco Never Used    Social History   Substance and Sexual Activity  Alcohol Use No    Family History  Problem Relation Age of Onset  .  Stroke Mother   . Hypertension Father   . Diabetes Father   . Glaucoma Brother   . Glaucoma Sister     Review of Systems: The review of systems is per the HPI.   All other systems were reviewed and are negative.  Physical Exam: BP 118/70 (BP Location: Left Arm, Cuff Size: Normal)   Pulse (!) 39   Ht 6' (1.829 m)   Wt 177 lb (80.3 kg)   SpO2 97%   BMI 24.01 kg/m  GENERAL:  Elderly  WM in NAD HEENT:  PERRL, EOMI, sclera are clear. Oropharynx is clear. HOH NECK:  No jugular venous distention, carotid upstroke brisk and symmetric, no bruits, no thyromegaly or adenopathy LUNGS:  Clear to auscultation bilaterally CHEST:  Unremarkable HEART:  RRR,  PMI not displaced or sustained,S1 and S2 within normal limits, no S3, no S4: no  clicks, no rubs, no murmurs ABD:  Soft, nontender. BS +, no masses or bruits. No hepatomegaly, no splenomegaly EXT:  2 + pulses throughout, no edema, no cyanosis no clubbing SKIN:  Warm and dry.  No rashes NEURO:  Alert and oriented x 3. Cranial nerves II through XII intact. Resting tremor.  PSYCH:  Cognitively intact     LABORATORY DATA: Lab Results  Component Value Date   WBC CANCELED 11/18/2019   HGB CANCELED 11/18/2019   HCT CANCELED 11/18/2019   PLT CANCELED 11/18/2019   GLUCOSE 100 (H) 02/10/2020   CHOL 117 11/18/2019   TRIG 80 11/18/2019   HDL 50 11/18/2019   LDLCALC 51 11/18/2019   ALT 6 11/18/2019   AST 17 11/18/2019   NA 138 02/10/2020   K 3.8 02/10/2020   CL 98 02/10/2020   CREATININE 0.97 02/10/2020   BUN 10 02/10/2020   CO2 26 02/10/2020   TSH     1.873  12/03/2008   INR 1.5 12/13/2008   HGBA1C  12/06/2008    5.3 (NOTE)   The ADA recommends the following therapeutic goal for glycemic   control related to Hgb A1C measurement:   Goal of Therapy:   < 7.0% Hgb A1C   Reference: American Diabetes Association: Clinical Practice   Recommendations 2008, Diabetes Care,  2008, 31:(Suppl 1).      Labs dated 03/31/17: creatinine 1.2 otherwise CMET normal. Hgb 16. plts 148 K. Cholesterol 103, triglycerides 65, HDL 44, LDL 46. PSA 31,   Labs done at [facility] September 2021  K+ 3.6 other chemistries normal.   Assessment / Plan:  1.  CAD - prior CABG in 2010. Normal Myoview study October 2013. He remains  asymptomatic. Activity is very limited.  Continue current medical therapy with ASA, statin, Bystolic.  2. HTN - blood pressure under good control. Continue low dose Bystolic and HCTZ.  3. Parkinson's disease- per Neurology  4. Hyperlipidemia-on Crestor. Labs in February satisfactory.   5. Severe osteoarthritis of the knees. Continue conservative therapy.  6. Urinary retention. Has foley in place. Follow up with urology.   I will follow up in 6 months.

## 2020-12-12 ENCOUNTER — Other Ambulatory Visit: Payer: Self-pay

## 2020-12-12 ENCOUNTER — Ambulatory Visit (INDEPENDENT_AMBULATORY_CARE_PROVIDER_SITE_OTHER): Payer: Medicare Other | Admitting: Cardiology

## 2020-12-12 ENCOUNTER — Encounter: Payer: Self-pay | Admitting: Cardiology

## 2020-12-12 VITALS — BP 118/70 | HR 39 | Ht 72.0 in | Wt 177.0 lb

## 2020-12-12 DIAGNOSIS — E785 Hyperlipidemia, unspecified: Secondary | ICD-10-CM

## 2020-12-12 DIAGNOSIS — I10 Essential (primary) hypertension: Secondary | ICD-10-CM | POA: Diagnosis not present

## 2020-12-12 DIAGNOSIS — I25810 Atherosclerosis of coronary artery bypass graft(s) without angina pectoris: Secondary | ICD-10-CM | POA: Diagnosis not present

## 2021-01-07 ENCOUNTER — Other Ambulatory Visit: Payer: Self-pay | Admitting: Cardiology

## 2021-03-13 ENCOUNTER — Telehealth: Payer: Self-pay | Admitting: Cardiology

## 2021-03-13 NOTE — Telephone Encounter (Signed)
    Pt c/o BP issue: STAT if pt c/o blurred vision, one-sided weakness or slurred speech  1. What are your last 5 BP readings? 86/53, pt's BP machine 86/60  2. Are you having any other symptoms (ex. Dizziness, headache, blurred vision, passed out)? Legs weak yesterday  3. What is your BP issue? Pt's wife said home health nurse visited pt today, pt's BP was 86/53 so they tried using pt's own BP machine and BP is 86/60. She said pt is not complaining any symptoms but yesterday his legs feels weak. She would like to speak with Dr. Elvis Coil nurse

## 2021-03-13 NOTE — Telephone Encounter (Signed)
Unable to reach pt or leave a message, no answer or mailbox

## 2021-03-18 NOTE — Telephone Encounter (Signed)
Spoke to patient's wife Dr.Jordan's advice given.Advised to call back if B/P continues to be low.

## 2021-03-18 NOTE — Telephone Encounter (Signed)
Patient's wife is following up, requesting to speak with Elnita Maxwell, LPN.

## 2021-03-18 NOTE — Telephone Encounter (Signed)
Spoke to patient's wife.She stated husband's B/P has been low.Last week B/P 86/53,86/60,123/81.This morning 92/63,105/68.Pulse 65.Stated he is taking Nebivolol 5 mg daily,Hctz 25 mg daily.Advised I will send message to Dr.Jordan for advice.

## 2021-03-18 NOTE — Telephone Encounter (Signed)
Follow up:    Patient wife calling back from last week.

## 2021-03-18 NOTE — Telephone Encounter (Signed)
Stop HCTZ. Hold nebivolol for one day then resume. Let us know if problem persists.  Harry Wain [place] MD, [facility]

## 2021-03-21 NOTE — Addendum Note (Signed)
Addended by: Alyson Ingles on: 03/21/2021 11:31 AM   Modules accepted: Orders

## 2021-03-21 NOTE — Telephone Encounter (Signed)
BP still running low, what would you like to do now

## 2021-03-21 NOTE — Telephone Encounter (Signed)
PT's wife is calling back in his BP is still running low. BP reading at 10 AM 85/57 HR 75 Yesterday's reading 91/66 HR 75

## 2021-03-21 NOTE — Telephone Encounter (Signed)
Returned call to [place]) [facility]) informed her to have pt hold Bystolic and see if his BP comes back up to normal. Verbalizes understanding. She will call back if any sx/issues develop.

## 2021-03-26 ENCOUNTER — Telehealth: Payer: Self-pay | Admitting: Cardiology

## 2021-03-26 NOTE — Telephone Encounter (Signed)
Returned call to patient's wife Patient reported slight dizziness this morning for a few seconds He was just doing normal daily activities She reports BP has been running low since June 6  6/6 - 92/63 HR 65, 105/68 6/8 - 91/66 HR 75 6/9 - 85/57 HR 75 6/10 - 105/67 HR 66 6/11 - 109/80 HR 77 6/12 - 98/67 HR 81, 161/99 HR 66 (4pm) 6/13 - 104/69 HR 80, 109/83 HR 78 6/14 - 87/65 HR 83, 95/61 HR 73, 135/85 HR 65  She reports his BP is low in the AM, goes up after lunch  He has been holding bystolic as previously advise in phone note (6/9)  He has a urinary catheter and is not peeing as much as he was. He has good oral intake per wife.   Advised will send message to MD to review and advise on persistently low BP

## 2021-03-26 NOTE — Telephone Encounter (Signed)
Pt c/o BP issue: STAT if pt c/o blurred vision, one-sided weakness or slurred speech  1. What are your last 5 BP readings? 87-65 HR 83 (10am); 95/61 HR 73 (11:30am) 135/85 HR 65 (3:00pm)  2. Are you having any other symptoms (ex. Dizziness, headache, blurred vision, passed out)? dizziness  3. What is your BP issue? low

## 2021-03-26 NOTE — Telephone Encounter (Signed)
Spoke to patient's wife.Advised Dr.Jordan is out of office this week.Stated husband is probably not eating enough.Stated they have meals on wheels due to her back problem.Advised to make sure he eats well and drinks plenty of water.Stated home health comes out every month to change his foley catheter.Stated she checks his B/P daily and sends readings to home health.She notices B/P will go up in afternoons.Advised I will call back after Dr.Jordan reviews B/P readings.

## 2021-03-31 NOTE — Telephone Encounter (Signed)
It looks like he is now off all antihypertensives. May be developing some autonomic dysfunction related to his Parkinson's. Recommend starting midodrine 5 mg bid to raise BP.  Should have follow up in next few weeks to address.  Karessa Onorato [place] MD, [facility]

## 2021-04-01 NOTE — Telephone Encounter (Signed)
Called patient's wife left message on personal voice mail to call back. 

## 2021-04-02 ENCOUNTER — Telehealth: Payer: Self-pay

## 2021-04-02 MED ORDER — MIDODRINE HCL 5 MG PO TABS: 5.0000 mg | ORAL_TABLET | Freq: Two times a day (BID) | ORAL | 6 refills | Status: DC

## 2021-04-02 NOTE — Telephone Encounter (Signed)
Spoke with patient's wife Dr.Jordan advised to take Midodrine 5 mg twice a day.Appointment scheduled with Dr.Jordan 7/29 at 10:20 am.

## 2021-04-02 NOTE — Telephone Encounter (Signed)
Received a call from patient's wife.She stated husband B/P has been elevated today.Stated his foley cath was stopped up.Stated he was in severe lower abd pain.He saw urologist B/P at office 200 systolic.Last reading 165/105.Stated they just got home and she has not checked B/P.Spoke to Dr.Jordan he advised not to take Midodrine if systolic B/P 120 or greater.Advised to monitor B/P and call back if needed.

## 2021-04-02 NOTE — Telephone Encounter (Signed)
Ronnald Collum is returning Cheryl's call. Please advise.

## 2021-04-05 NOTE — Telephone Encounter (Signed)
Spoke to patient's wife she stated husband's B/P has been elevated this afternoon.Stated this morning B/P 99/70 pulse 81.This afternoon 135/95 pulse 90.At present 159/100 pulse 73.Stated she has only gave Midodrine 1 time.Advised ok to restart Bystolic 5 mg daily over the weekend.She will continue to monitor B/P.Call back on Monday to report B/P readings.

## 2021-04-05 NOTE — Telephone Encounter (Signed)
    Pt is calling back she wanted to speak with Elnita Maxwell again about pt's BP, she said it's still going up and down

## 2021-04-08 NOTE — Telephone Encounter (Signed)
Spoke to patient's wife.She stated she has been giving Bystolic 5 mg daily over the weekend.Stated B/P has been better 142/92,114/77.Spoke to Dr.Jordan he advised ok to take Bystolic 5 mg daily if B/P 150/100 or greater.Advised to hold Bystolic if less than 150/100. She will continue to monitor B/P.Advised to keep appointment as scheduled 7/29 at 10:20 am.Advised to call sooner if needed.

## 2021-05-08 NOTE — Progress Notes (Signed)
Harry Morgan Date of Birth: [birth year] Medical Record #[id number]  History of Present Illness: Harry Morgan is seen today for follow up of CAD. He has known CAD with prior CABG in February 2010. Other issues include HTN and HLD. He is limited by his arthritis and Parkinson's. His last Myoview study in October 2013 was normal. He experienced a syncopal episode in Feb 2016. No arrhythmia noted. BP low. Cranial CT and Echo unremarkable. Requip and losartan HCT held.   He has urinary retention. Followed by [facility] urology. No improvement with Flomax and Proscar. Indwelling foley in place. Flomax stopped due to orthostasis.  Has difficult time walking due to arthritis and Parkinson's disease. Wife is primary caregiver. He denies any chest pain or dyspnea.  BP readings are typcially 90-105 systolic. Has occasional high readings. No lightheadedness or dizziness.  Bystolic held and recommended he take it only if BP > 150/90.    Current Outpatient Medications on File Prior to Visit  Medication Sig Dispense Refill   Acetaminophen (TYLENOL ARTHRITIS PAIN PO) Take 1 tablet by mouth daily as needed. For pain     aspirin EC 81 MG tablet Take 1 tablet (81 mg total) by mouth daily. 90 tablet 3   carbidopa-levodopa (SINEMET) 25-100 MG per tablet Take 1-2 tablets by mouth See admin instructions. Take 1 tablet in the morning, 1 tablets at lunch, 1 at 4pm and 1 tablet at night.     Cholecalciferol (VITAMIN D PO) Take 1,000 mg by mouth every morning.      docusate sodium (COLACE) 250 MG capsule Take 250 mg by mouth daily.     nebivolol (BYSTOLIC) 5 MG tablet Take 5 mg by mouth daily. IF SYSTOLIC IS OVER 163 BPM     polyethylene glycol (MIRALAX / GLYCOLAX) 17 g packet Take 17 g by mouth daily.     Psyllium (METAMUCIL PO) Take 5 mLs by mouth at bedtime.     rosuvastatin (CRESTOR) 10 MG tablet TAKE 1 TABLET EVERY DAY 90 tablet 1   sertraline (ZOLOFT) 25 MG tablet Take 25 mg by mouth daily.     No current  facility-administered medications on file prior to visit.    No Known Allergies  Past Medical History:  Diagnosis Date   Arthritis of spine    Atrial fibrillation (HCC)    Postoperative   Coronary artery disease 2010   Status post CABG. This included an LIMA graft to the LAD, saphenous vein graft to the diagonal,  saphenous vein graft to the first and second obtuse marginal vessels, saphenous vein graft to the distal right coronary   Dyslipidemia    FHx: coronary artery disease    Hypertension    Parkinson disease (HCC)    Renal calculi     Past Surgical History:  Procedure Laterality Date   CORONARY ARTERY BYPASS GRAFT  11/2008   HERNIA REPAIR     Status post x3   KNEE SURGERY     Status post arthroscopic    Social History   Tobacco Use  Smoking Status Former  Smokeless Tobacco Never    Social History   Substance and Sexual Activity  Alcohol Use No    Family History  Problem Relation Age of Onset   Stroke Mother    Hypertension Father    Diabetes Father    Glaucoma Brother    Glaucoma Sister     Review of Systems: The review of systems is per the HPI.   All other systems  were reviewed and are negative.  Physical Exam: BP (!) 126/94 (BP Location: Left Arm, Patient Position: Sitting, Cuff Size: Normal)   Pulse 90   Ht 6' (1.829 m)   BMI 24.01 kg/m  GENERAL:  Elderly  WM in NAD HEENT:  PERRL, EOMI, sclera are clear. Oropharynx is clear. HOH NECK:  No jugular venous distention, carotid upstroke brisk and symmetric, no bruits, no thyromegaly or adenopathy LUNGS:  Clear to auscultation bilaterally CHEST:  Unremarkable HEART:  RRR,  PMI not displaced or sustained,S1 and S2 within normal limits, no S3, no S4: no clicks, no rubs, no murmurs ABD:  Soft, nontender. BS +, no masses or bruits. No hepatomegaly, no splenomegaly EXT:  2 + pulses throughout, no edema, no cyanosis no clubbing SKIN:  Warm and dry.  No rashes NEURO:  Alert and oriented x 3. Cranial  nerves II through XII intact. Resting tremor.  PSYCH:  Cognitively intact     LABORATORY DATA:  Labs dated 03/31/17: creatinine 1.2 otherwise CMET normal. Hgb 16. plts 148 K. Cholesterol 103, triglycerides 65, HDL 44, LDL 46. PSA 31,   Labs done at [facility] September 2021  K+ 3.6 other chemistries normal.   Assessment / Plan:  1.  CAD - prior CABG in 2010. Normal Myoview study October 2013. He remains  asymptomatic. Activity is very limited.  Continue current medical therapy with ASA, statin.   2. HTN - now with persistently low readings. Only takes Bystolic if BP sustained > 150.   3. Parkinson's disease- per Neurology  4. Hyperlipidemia-on Crestor. Will update labs today.  5. Severe osteoarthritis of the knees. Continue conservative therapy.  6. Urinary retention. Has foley in place. Follow up with urology.   I will follow up in 6 months.

## 2021-05-10 ENCOUNTER — Encounter: Payer: Self-pay | Admitting: Cardiology

## 2021-05-10 ENCOUNTER — Other Ambulatory Visit: Payer: Self-pay | Admitting: Cardiology

## 2021-05-10 ENCOUNTER — Ambulatory Visit (INDEPENDENT_AMBULATORY_CARE_PROVIDER_SITE_OTHER): Payer: Medicare Other | Admitting: Cardiology

## 2021-05-10 ENCOUNTER — Other Ambulatory Visit: Payer: Self-pay

## 2021-05-10 VITALS — BP 126/94 | HR 90 | Ht 72.0 in

## 2021-05-10 DIAGNOSIS — I25810 Atherosclerosis of coronary artery bypass graft(s) without angina pectoris: Secondary | ICD-10-CM | POA: Diagnosis not present

## 2021-05-10 DIAGNOSIS — I10 Essential (primary) hypertension: Secondary | ICD-10-CM | POA: Diagnosis not present

## 2021-05-10 DIAGNOSIS — E785 Hyperlipidemia, unspecified: Secondary | ICD-10-CM

## 2021-05-10 DIAGNOSIS — G20 Parkinson's disease: Secondary | ICD-10-CM

## 2021-05-10 LAB — COMPREHENSIVE METABOLIC PANEL
ALT: 6 IU/L (ref 0–44)
AST: 18 IU/L (ref 0–40)
Albumin/Globulin Ratio: 1.6 (ref 1.2–2.2)
Albumin: 4.4 g/dL (ref 3.5–4.6)
Alkaline Phosphatase: 79 IU/L (ref 44–121)
BUN/Creatinine Ratio: 11 (ref 10–24)
BUN: 11 mg/dL (ref 10–36)
Bilirubin Total: 0.7 mg/dL (ref 0.0–1.2)
CO2: 22 mmol/L (ref 20–29)
Calcium: 9.9 mg/dL (ref 8.6–10.2)
Chloride: 101 mmol/L (ref 96–106)
Creatinine, Ser: 1 mg/dL (ref 0.76–1.27)
Globulin, Total: 2.8 g/dL (ref 1.5–4.5)
Glucose: 87 mg/dL (ref 65–99)
Potassium: 4 mmol/L (ref 3.5–5.2)
Sodium: 142 mmol/L (ref 134–144)
Total Protein: 7.2 g/dL (ref 6.0–8.5)
eGFR: 71 mL/min/{1.73_m2} (ref >59)

## 2021-05-10 LAB — CBC WITH DIFFERENTIAL/PLATELET
Basophils Absolute: 0 10*3/uL (ref 0.0–0.2)
Basos: 1 %
EOS (ABSOLUTE): 0.1 10*3/uL (ref 0.0–0.4)
Eos: 1 %
Hematocrit: 46.2 % (ref 37.5–51.0)
Hemoglobin: 15.8 g/dL (ref 13.0–17.7)
Immature Grans (Abs): 0 10*3/uL (ref 0.0–0.1)
Immature Granulocytes: 0 %
Lymphocytes Absolute: 1.6 10*3/uL (ref 0.7–3.1)
Lymphs: 27 %
MCH: 32.2 pg (ref 26.6–33.0)
MCHC: 34.2 g/dL (ref 31.5–35.7)
MCV: 94 fL (ref 79–97)
Monocytes Absolute: 0.4 10*3/uL (ref 0.1–0.9)
Monocytes: 6 %
Neutrophils Absolute: 3.9 10*3/uL (ref 1.4–7.0)
Neutrophils: 65 %
Platelets: 161 10*3/uL (ref 150–450)
RBC: 4.91 x10E6/uL (ref 4.14–5.80)
RDW: 11.5 % — ABNORMAL LOW (ref 11.6–15.4)
WBC: 6 10*3/uL (ref 3.4–10.8)

## 2021-05-10 LAB — LIPID PANEL
Chol/HDL Ratio: 2.3 ratio (ref 0.0–5.0)
Cholesterol, Total: 116 mg/dL (ref 100–199)
HDL: 51 mg/dL (ref >39)
LDL Chol Calc (NIH): 52 mg/dL (ref 0–99)
Triglycerides: 59 mg/dL (ref 0–149)
VLDL Cholesterol Cal: 13 mg/dL (ref 5–40)

## 2021-05-21 ENCOUNTER — Telehealth: Payer: Self-pay | Admitting: Cardiology

## 2021-05-21 NOTE — Telephone Encounter (Signed)
Kim from [facility] states they are seeing the patient for nursing. She says he does not have a PCP and is requesting orders for physical therapy, OT, and Child psychotherapist. Phone: [phone]

## 2021-05-21 NOTE — Telephone Encounter (Addendum)
Spoke to [employer] with [facility] Dr.Jordan advised ok to order PT,OT and Child psychotherapist.

## 2021-05-27 ENCOUNTER — Ambulatory Visit: Payer: Medicare Other | Admitting: Physician Assistant

## 2021-05-27 ENCOUNTER — Telehealth: Payer: Self-pay | Admitting: Cardiology

## 2021-05-27 NOTE — Telephone Encounter (Signed)
Pt c/o BP issue: STAT if pt c/o blurred vision, one-sided weakness or slurred speech  1. What are your last 5 BP readings? Today 8:30 am 133/95/69, 10:35 am 69/49, home machine 81/60/65, Last night 5:20 153/98/84  2. Are you having any other symptoms (ex. Dizziness, headache, blurred vision, passed out)? States feels okay  3. What is your BP issue? Patient's wife states the patient's BP has been up and down. She states the home health nurse called and may also send the patient's vitals. She says the patient has no symptoms.

## 2021-05-27 NOTE — Telephone Encounter (Signed)
Pts wife Ronnald Collum (on [place]) is calling to give some BP/HR recordings to Dr. [place] and nurse on the pt.  Wife adds that some readings were taken with their own personal BP monitor, and some were taken on the BP monitor which home healthcare provides and it bluetooths those recordings to their service thereafter.  Wife states that home healthcare requires them to electronically send them recordings through bluetooth everyday around lunch time.  Below are all recordings:  Last night (8/14) at 5:20 pm - Take with pts home BP monitor- 153/98 HR-84  This morning (8/15)-taken with pts home BP monitor and before med administration- 0830- 133/95 HR-69  This morning (8/15)- Taken with pts home BP monitor and 2 meds administered-stool softener and Sinemet- 1100-81/65 HR-65  This morning (8/15)- taken with home health BP monitor that bluetooths results to RN- 1110- 69/49 no HR provided  Noon today (8/15)-taken with home health BP monitor that bluetooths results to RN 12 pm- 112/73 HR- 64   Wife wants adds that with these fluctuating recordings, pt is completely asymptomatic.  She states he has no dizziness or lightheadedness, and he has no pre-syncopal or syncopal episodes. Wife states that Dr. [place] did at last OV prescribe for the pt to take midodrine 5 mg po bid, but I do not see this on his med list.  Wife states she has not given the pt this medication, and would like to clarify with Dr. [place] if she should be giving him this regimen or not.  Informed the pts wife that I will route this message to Dr. [place] and his nurse for further review and recommendation, and follow-up with the wife accordingly thereafter.  Wife states if Elnita Maxwell calls her back, she will be gone for the next hour to the store, but will be back soon. Will endorse this to Dr. Elvis Coil nurse. Wife verbalized understanding and agrees with this plan.

## 2021-05-28 ENCOUNTER — Other Ambulatory Visit: Payer: Self-pay | Admitting: Cardiology

## 2021-05-28 NOTE — Telephone Encounter (Signed)
I would give midodrin PRN for systolic BP less than 90  Briani Maul [place] MD, [facility]

## 2021-05-28 NOTE — Telephone Encounter (Addendum)
Spoke to patient's wife she stated husband's B/P this morning at 8:30 am 145/106 pulse 83 at 9:30 am 95/73 pulse 84.He only takes Nebivolol 5 mg daily as needed if systolic greater than 150.Stated she is using electronic B/P cuff from [facility]. Spoke to [facility] about B/P difference.She will have RN go out to home to check electronic B/P device accuracy.

## 2021-05-29 NOTE — Telephone Encounter (Signed)
Spoke to patient's wife Dr.Jordan advised take Midodrine 5 mg twice a day only if systolic B/P less than 90.Advised to call back if needed.

## 2021-05-29 NOTE — Addendum Note (Signed)
Addended by: Neoma Laming on: 05/29/2021 10:37 AM   Modules accepted: Orders

## 2021-07-03 ENCOUNTER — Telehealth: Payer: Self-pay | Admitting: Cardiology

## 2021-07-03 NOTE — Telephone Encounter (Signed)
Called patient wife, advised of message from MD.  Wife verbalized understanding.

## 2021-07-03 NOTE — Telephone Encounter (Signed)
If he is asymptomatic then I wouldn't be concerned about low readings. He has bystolic to take for high readings PRN  Auna Mikkelsen [place] MD, [facility]

## 2021-07-03 NOTE — Telephone Encounter (Signed)
Called patient, spoke with wife she states that husbands blood pressures are below- he continues to have blood pressure all over the place, his home health nurse is concerned. She states that she has only been giving his midodrine once daily not twice daily (per last telephone note). Patient wife also states that he has no symptoms or none that he has mentioned. They did take an aspirin the other night because he stated he felt strange but then it went away and he felt fine.  I advised I would route a message to MD/PharmD to review and give recommendations and we would call back.

## 2021-07-03 NOTE — Telephone Encounter (Signed)
Pt c/o BP issue: STAT if pt c/o blurred vision, one-sided weakness or slurred speech  1. What are your last 5 BP readings?  06/29/21:  9:00 am 129/90  11:00 am 96/65  06/30/21:  9:00 am 155/96 11:00 am  98/67 07/01/21  8:35 am 173/98  10:30 am 88/59  07/02/21:  9:00 am 142/101  11:00 am 97/67  4:15 pm 157/96  07/03/21: 8:15 am 181/86  2. Are you having any other symptoms (ex. Dizziness, headache, blurred vision, passed out)? No. Patient has not mentioned anything like that to his wife  3. What is your BP issue? Wife is concerned about fluctuating BP  Pt c/o medication issue:  1. Name of Medication: carbidopa-levodopa (SINEMET) 25-100 MG per tablet  2. How are you currently taking this medication (dosage and times per day)? One tablet twice daily  3. Are you having a reaction (difficulty breathing--STAT)?   4. What is your medication issue? This medication is for the patient's Parkinsons. The wife thinks this medication could be making his BP fluctuate

## 2021-07-10 ENCOUNTER — Telehealth: Payer: Self-pay | Admitting: Cardiology

## 2021-07-10 MED ORDER — AMLODIPINE BESYLATE 5 MG PO TABS: 5.0000 mg | ORAL_TABLET | Freq: Every day | ORAL | 3 refills | Status: DC

## 2021-07-10 NOTE — Telephone Encounter (Signed)
Spoke to patient's wife she is concerned patient's B/P has been elevated.Readings listed below.Stated she wanted to ask Dr.Jordan if he needs to take Bystolic every day.Advised he is suppose to take Bystolic 5 mg every day if systolic B/P 150 or greater.She wants to know if he needs something else.Advised I will send message to Dr.Jordan for advice.

## 2021-07-10 NOTE — Telephone Encounter (Signed)
Let's start him on amlodipine 5 mg daily    Ephrem Carrick [place] MD, [facility]

## 2021-07-10 NOTE — Telephone Encounter (Signed)
Pt c/o BP issue: STAT if pt c/o blurred vision, one-sided weakness or slurred speech  1. What are your last 5 BP readings? Yesterday 182/92 pulse was 63,  this morning it was 168/97 pulse 65 as 197/100 on 07-06-21 179/107  2. Are you having any other symptoms (ex. Dizziness, headache, blurred vision, passed out)? no  3. What is your BP issue? Blood pressure running high

## 2021-07-10 NOTE — Telephone Encounter (Signed)
Spoke to patient's wife Dr.Jordan advised to start Amlodipine 5 mg daily.Advised he can still take Bystolic 5 mg daily if systolic B/P 150 or greater.She will continue to monitor B/P and call back if B/P remains elevated.

## 2021-07-24 ENCOUNTER — Telehealth: Payer: Self-pay

## 2021-07-24 NOTE — Telephone Encounter (Signed)
Spoke to patient's wife Dr.Jordan reviewed B/P readings received from [facility].He advised B/P too low.Stop Amlodipine.May take Amlodipine 5 mg 1/2 tablet if systolic B/P greater than 150.

## 2021-07-24 NOTE — Telephone Encounter (Signed)
Called patient's wife left message on personal voice mail to call back about B/P readings received from [facility].

## 2021-07-25 ENCOUNTER — Telehealth: Payer: Self-pay | Admitting: Cardiology

## 2021-07-25 NOTE — Telephone Encounter (Signed)
Patient spouse wanted to talk with Dr. [place] or nurse in regards to the medication nebivolol (BYSTOLIC) 5 MG tablet. Want to know if the patient has to take the medication everyday. Patient is having a hard time relegating blood pressure. Please call back

## 2021-07-25 NOTE — Telephone Encounter (Signed)
Spoke with patient's wife who needs clarification on bystolic -- should he take this on a daily basis OR as needed based on SBP -- 9/28 phone note mentions some med changes - bystolic to be taken if SBP >150 -- 10/12 phone note mentions to use amlodipine 2.5mg  if SBP >150  BP at home - results sent to office by [facility] SBP 113 this morning -- he has only taken ASA 81, stool softener, sinemet  Checks BP again between 10-11am and sometimes SBP is in the 80s  Wife would like to know if patient should take bystolic daily?  Sent to MD to review

## 2021-07-25 NOTE — Telephone Encounter (Signed)
[place], Peter M, MD  You 40 minutes ago (1:08 PM)   He should only take PRN. Not daily   Peter [place] MD, [facility]    Relayed message to wife

## 2021-08-15 ENCOUNTER — Telehealth: Payer: Self-pay | Admitting: Cardiology

## 2021-08-15 NOTE — Telephone Encounter (Signed)
Pt c/o medication issue:  1. Name of Medication:  amLODipine (NORVASC) 5 MG tablet midodrine (PROAMATINE) 5 MG tablet  2. How are you currently taking this medication (dosage and times per day)?   3. Are you having a reaction (difficulty breathing--STAT)?   4. What is your medication issue?    Bethany with [facility] states she has concerns with the patient's wife checking patient's BP and adjusting medication so often. She states they have an in-home telemetry unit which has been picking up BP readings 3-4 times daily. Per Toma Copier, patient's wife will take BP first thing in the morning prior to giving him medication and then she'll check it about 1 hour after medication, but if its changes drastically she adjusts his medication again. Toma Copier states she attempted to explain the importance of not adjusting his medication as often as she is, but she is hopefull that we can contact the patient to reiterate and clarify medication instructions. Toma Copier would also like to clarify patient's BP parameters.     Pt c/o BP issue: STAT if pt c/o blurred vision, one-sided weakness or slurred speech  1. What are your last 5 BP readings?   11/02: 132/87   76/56 pm  11/03: 160/100  2. Are you having any other symptoms (ex. Dizziness, headache, blurred vision, passed out)?  No   3. What is your BP issue?   See above

## 2021-08-16 NOTE — Telephone Encounter (Signed)
Called pt's wife. Went over the parameters as to which she is to give her husband blood pressure medication. She verbalized understanding.

## 2021-09-03 ENCOUNTER — Telehealth: Payer: Self-pay

## 2021-09-03 MED ORDER — AMLODIPINE BESYLATE 5 MG PO TABS: ORAL_TABLET | ORAL | 3 refills | Status: DC

## 2021-09-03 NOTE — Telephone Encounter (Signed)
Spoke to [facility].Dr.Jordan reviewed recent B/P readings.He advised only take Amlodipine 5 mg 1/2 tablet daily if systolic B/P greater than 180 systolic.

## 2021-09-09 ENCOUNTER — Telehealth: Payer: Self-pay | Admitting: Cardiology

## 2021-09-09 NOTE — Telephone Encounter (Signed)
Patient's wife called wanting to speak to Harry Morgan.

## 2021-09-09 NOTE — Telephone Encounter (Signed)
Spoke to patient's wife.Dr.Jordan reviewed B/P readings from [facility].He advised only take Amlodipine 2.5 mg daily if systolic B/P greater than 180.She stated husband is presently at [facility] in [place]. Spoke to [place] at [facility] order given for Amlodipine 2.5 mg daily take only if systolic B/P greater than 180.

## 2021-12-03 ENCOUNTER — Telehealth: Payer: Self-pay | Admitting: Cardiology

## 2021-12-03 NOTE — Telephone Encounter (Signed)
Patient's wife would like to speak to [place] about her husband. She wants to update her with everything that's been going on with him.

## 2021-12-03 NOTE — Telephone Encounter (Signed)
Spoke to patient's wife.She stated husband got a infection from a foley catheter change this past Nov.Stated he was admitted to hospital and was discharged to a rehab center.Stated he came back home 10/22/21.He has been unable to walk since infection.She has a caregiver 8 hours everyday.Stated husband missed his follow up visit with Dr.Jordan.She wanted to know if he could have a phone visit. Advised I will ask Dr.Jordan and call her back.

## 2021-12-04 NOTE — Telephone Encounter (Signed)
Spoke to wife virtual visit scheduled with Dr.Jordan 3/14 at 11:40 am.

## 2021-12-19 NOTE — Progress Notes (Unsigned)
{Choose 1 Note Type (Video or Telephone):[id number]}    Date:  12/19/2021   ID:  [name], DOB 1928-04-26, MRN [id number] The patient was identified using 2 identifiers.  Patient Location: Home Provider Location: Office/Clinic   PCP:  Pearson Forster, MD   [facility] Providers Cardiologist:  Polk Minor [place], MD { Click to update primary MD,subspecialty MD or APP then REFRESH:1}    Evaluation Performed:  Follow-Up Visit  Chief Complaint:  CAD  History of Present Illness:    Harry Morgan is a 88 y.o. male with known CAD with prior CABG in February 2010. Other issues include HTN and HLD. He is limited by his arthritis and Parkinson's. His last Myoview study in October 2013 was normal. He experienced a syncopal episode in Feb 2016. No arrhythmia noted. BP low. Cranial CT and Echo unremarkable. Requip and losartan HCT held.    He has urinary retention. Followed by [facility] urology. No improvement with Flomax and Proscar. Indwelling foley in place. Flomax stopped due to orthostasis.  Has difficult time walking due to arthritis and Parkinson's disease. Wife is primary caregiver. He denies any chest pain or dyspnea.  The patient {does/does not:200015} have symptoms concerning for COVID-19 infection (fever, chills, cough, or new shortness of breath).    Past Medical History:  Diagnosis Date   Arthritis of spine    Atrial fibrillation (HCC)    Postoperative   Coronary artery disease 2010   Status post CABG. This included an LIMA graft to the LAD, saphenous vein graft to the diagonal,  saphenous vein graft to the first and second obtuse marginal vessels, saphenous vein graft to the distal right coronary   Dyslipidemia    FHx: coronary artery disease    Hypertension    Parkinson disease (HCC)    Renal calculi    Past Surgical History:  Procedure Laterality Date   CORONARY ARTERY BYPASS GRAFT  11/2008   HERNIA REPAIR     Status post x3   KNEE SURGERY     Status post  arthroscopic     No outpatient medications have been marked as taking for the 12/24/21 encounter (Appointment) with [place], Shea Swalley M, MD.     Allergies:   Patient has no known allergies.   Social History   Tobacco Use   Smoking status: Former   Smokeless tobacco: Never  Building services engineer Use: Never used  Substance Use Topics   Alcohol use: No   Drug use: No     Family Hx: The patient's family history includes Diabetes in his father; Glaucoma in his brother and sister; Hypertension in his father; Stroke in his mother.  ROS:   Please see the history of present illness.    *** All other systems reviewed and are negative.   Prior CV studies:   The following studies were reviewed today:  ***  Labs/Other Tests and Data Reviewed:    EKG:  {EKG/Telemetry Strips Reviewed:[phone]}  Recent Labs: 05/10/2021: ALT 6; BUN 11; Creatinine, Ser 1.00; Hemoglobin 15.8; Platelets 161; Potassium 4.0; Sodium 142   Recent Lipid Panel Lab Results  Component Value Date/Time   CHOL 116 05/10/2021 11:12 AM   TRIG 59 05/10/2021 11:12 AM   HDL 51 05/10/2021 11:12 AM   CHOLHDL 2.3 05/10/2021 11:12 AM   CHOLHDL 2.3 09/24/2016 01:33 PM   LDLCALC 52 05/10/2021 11:12 AM    Wt Readings from Last 3 Encounters:  12/12/20 177 lb (80.3 kg)  05/28/20 177 lb 3.2  oz (80.4 kg)  11/18/19 187 lb 3.2 oz (84.9 kg)     Risk Assessment/Calculations:   {Does this patient have ATRIAL FIBRILLATION?:[phone]}      Objective:    Vital Signs:  There were no vitals taken for this visit.   {HeartCare Virtual Exam (Optional):[phone]::"VITAL SIGNS:  reviewed"}  ASSESSMENT & PLAN:    1.  CAD - prior CABG in 2010. Normal Myoview study October 2013. He remains  asymptomatic. Activity is very limited.  Continue current medical therapy with ASA, statin.    2. HTN - now with persistently low readings. Only takes Bystolic if BP sustained > 150.    3. Parkinson's disease- per Neurology   4.  Hyperlipidemia-on Crestor. Will update labs today.   5. Severe osteoarthritis of the knees. Continue conservative therapy.   6. Urinary retention. Has foley in place. Follow up with urology.      {Are you ordering a CV Procedure (e.g. stress test, cath, DCCV, TEE, etc)?   Press F2        :[id number]}    COVID-19 Education: The signs and symptoms of COVID-19 were discussed with the patient and how to seek care for testing (follow up with PCP or arrange E-visit).  ***The importance of social distancing was discussed today.  Time:   Today, I have spent *** minutes with the patient with telehealth technology discussing the above problems.     Medication Adjustments/Labs and Tests Ordered: Current medicines are reviewed at length with the patient today.  Concerns regarding medicines are outlined above.   Tests Ordered: No orders of the defined types were placed in this encounter.   Medication Changes: No orders of the defined types were placed in this encounter.   Follow Up:  {F/U Format:[id number]} {follow up:15908}  Signed, Riyana Biel [place], MD  12/19/2021 7:48 AM    [facility]

## 2021-12-24 ENCOUNTER — Telehealth: Payer: Self-pay

## 2021-12-24 ENCOUNTER — Other Ambulatory Visit: Payer: Self-pay

## 2021-12-24 ENCOUNTER — Telehealth (INDEPENDENT_AMBULATORY_CARE_PROVIDER_SITE_OTHER): Payer: Medicare Other | Admitting: Cardiology

## 2021-12-24 ENCOUNTER — Encounter: Payer: Self-pay | Admitting: Cardiology

## 2021-12-24 VITALS — BP 114/74 | HR 85

## 2021-12-24 DIAGNOSIS — E785 Hyperlipidemia, unspecified: Secondary | ICD-10-CM

## 2021-12-24 DIAGNOSIS — I25810 Atherosclerosis of coronary artery bypass graft(s) without angina pectoris: Secondary | ICD-10-CM

## 2021-12-24 DIAGNOSIS — I10 Essential (primary) hypertension: Secondary | ICD-10-CM | POA: Diagnosis not present

## 2021-12-24 NOTE — Telephone Encounter (Signed)
?  Patient Consent for Virtual Visit  ? ? ?   ? ?[name] has provided verbal consent on 12/24/2021 for a virtual visit (video or telephone). ? ? ?CONSENT FOR VIRTUAL VISIT FOR:  Harry Morgan  ?By participating in this virtual visit I agree to the following: ? ?I hereby voluntarily request, consent and authorize [facility] and its employed or contracted physicians, physician assistants, nurse practitioners or other licensed health care professionals (the Practitioner), to provide me with telemedicine health care services (the ?Services") as deemed necessary by the treating Practitioner. I acknowledge and consent to receive the Services by the Practitioner via telemedicine. I understand that the telemedicine visit will involve communicating with the Practitioner through live audiovisual communication technology and the disclosure of certain medical information by electronic transmission. I acknowledge that I have been given the opportunity to request an in-person assessment or other available alternative prior to the telemedicine visit and am voluntarily participating in the telemedicine visit. ? ?I understand that I have the right to withhold or withdraw my consent to the use of telemedicine in the course of my care at any time, without affecting my right to future care or treatment, and that the Practitioner or I may terminate the telemedicine visit at any time. I understand that I have the right to inspect all information obtained and/or recorded in the course of the telemedicine visit and may receive copies of available information for a reasonable fee.  I understand that some of the potential risks of receiving the Services via telemedicine include:  ?Delay or interruption in medical evaluation due to technological equipment failure or disruption; ?Information transmitted may not be sufficient (e.g. poor resolution of images) to allow for appropriate medical decision making by the Practitioner;  and/or  ?In rare instances, security protocols could fail, causing a breach of personal health information. ? ?Furthermore, I acknowledge that it is my responsibility to provide information about my medical history, conditions and care that is complete and accurate to the best of my ability. I acknowledge that Practitioner's advice, recommendations, and/or decision may be based on factors not within their control, such as incomplete or inaccurate data provided by me or distortions of diagnostic images or specimens that may result from electronic transmissions. I understand that the practice of medicine is not an exact science and that Practitioner makes no warranties or guarantees regarding treatment outcomes. I acknowledge that a copy of this consent can be made available to me via my patient portal [facility] MyChart), or I can request a printed copy by calling the office of [facility].   ? ?I understand that my insurance will be billed for this visit.  ? ?I have read or had this consent read to me. ?I understand the contents of this consent, which adequately explains the benefits and risks of the Services being provided via telemedicine.  ?I have been provided ample opportunity to ask questions regarding this consent and the Services and have had my questions answered to my satisfaction. ?I give my informed consent for the services to be provided through the use of telemedicine in my medical care ? ? ? ?

## 2021-12-24 NOTE — Patient Instructions (Signed)
Medication Instructions:  ?Continue same medications ?*If you need a refill on your cardiac medications before your next appointment, please call your pharmacy* ? ? ?Lab Work: ?None ordered ? ? ?Testing/Procedures: ?None ordered ? ? ?Follow-Up: ?At [facility], you and your health needs are our priority.  As part of our continuing mission to provide you with exceptional heart care, we have created designated Provider Care Teams.  These Care Teams include your primary Cardiologist (physician) and Advanced Practice Providers (APPs -  Physician Assistants and Nurse Practitioners) who all work together to provide you with the care you need, when you need it. ? ?We recommend signing up for the patient portal called "MyChart".  Sign up information is provided on this After Visit Summary.  MyChart is used to connect with patients for Virtual Visits (Telemedicine).  Patients are able to view lab/test results, encounter notes, upcoming appointments, etc.  Non-urgent messages can be sent to your provider as well.   ?To learn more about what you can do with MyChart, go to [email or web address].   ? ?Your next appointment:  1 year    Call in Dec to schedule March appointment ?  ? ?The format for your next appointment: Office ? ? ?Provider: Dr.Jordan ? ? ?

## 2021-12-31 ENCOUNTER — Ambulatory Visit: Payer: Medicare Other | Admitting: Cardiology

## 2022-02-12 ENCOUNTER — Other Ambulatory Visit: Payer: Self-pay

## 2022-02-12 MED ORDER — HYDROCHLOROTHIAZIDE 25 MG PO TABS: ORAL_TABLET | ORAL | 1 refills | Status: DC

## 2023-01-09 ENCOUNTER — Other Ambulatory Visit: Payer: Self-pay | Admitting: Cardiology

## 2023-01-20 ENCOUNTER — Telehealth: Payer: Self-pay | Admitting: Cardiology

## 2023-01-20 NOTE — Telephone Encounter (Signed)
Patient's wife is requesting a call back from [place]. She states the patient is unable to come in to the office at all and she wants to arrange a virtual appointment if possible.

## 2023-01-20 NOTE — Telephone Encounter (Signed)
No answer. Phone continues to ring and ring. Will forward to Dr. [place] and primary nurse.

## 2023-01-26 NOTE — Telephone Encounter (Signed)
Called patient's wife left message on personal voice mail Dr.Jordan ok with a virtual visit.Advised to call back to schedule.

## 2023-01-26 NOTE — Telephone Encounter (Signed)
Patient is returning call. Please advise? 

## 2023-01-27 NOTE — Telephone Encounter (Signed)
Spoke to patient's wife phone visit appointment scheduled with Dr.Jordan 5/21 at 8:40 am.

## 2023-02-23 ENCOUNTER — Other Ambulatory Visit: Payer: Self-pay | Admitting: Cardiology

## 2023-02-25 NOTE — Progress Notes (Signed)
Virtual Visit via Telephone Note   This visit type was conducted due to national recommendations for restrictions regarding the COVID-19 Pandemic (e.g. social distancing) in an effort to limit this patient's exposure and mitigate transmission in our community.  Due to his co-morbid illnesses, this patient is at least at moderate risk for complications without adequate follow up.  This format is felt to be most appropriate for this patient at this time.  The patient did not have access to video technology/had technical difficulties with video requiring transitioning to audio format only (telephone).  All issues noted in this document were discussed and addressed.  No physical exam could be performed with this format.  Please refer to the patient's chart for his  consent to telehealth for [facility].    Date:  03/03/2023   ID:  Harry Morgan, DOB 10/30/1927, MRN [id number] The patient was identified using 2 identifiers.  Patient Location: Home Provider Location: Office/Clinic   PCP:  Penelope Galas, MD   [facility] Providers Cardiologist:  Asaf Elmquist [place], MD     Evaluation Performed:  Follow-Up Visit  Chief Complaint:  CAD  History of Present Illness:    Harry Morgan is a 89 y.o. male with known CAD with prior CABG in February 2010. Other issues include HTN and HLD. He is limited by his arthritis and Parkinson's. His last Myoview study in October 2013 was normal. He experienced a syncopal episode in Feb 2016. No arrhythmia noted. BP low. Cranial CT and Echo unremarkable. Requip and losartan HCT held.    He has urinary retention. Followed by [facility] urology. No improvement with Flomax and Proscar. Indwelling foley in place. Flomax stopped due to orthostasis.  Has difficult time walking due to arthritis and Parkinson's disease. Wife is primary caregiver.  He had a prolonged hospital stay in November 2022 with hematuria after a catheter change. Noted one 6 seconds pause on  monitor- asymptomatic while sleeping. Multiple medications stopped during admission including amlodipine, bystolic, Crestor and ASA. Only on low dose HCTZ now.   Wife reports still not able to get up. Is completely bed bound.  Denies any chest pain or dyspnea. Had Echo in the hospital in 2022 which was normal.  Family is planning on consulting with Palliative care. No recent infection.     Past Medical History:  Diagnosis Date   Arthritis of spine    Atrial fibrillation (HCC)    Postoperative   Coronary artery disease 2010   Status post CABG. This included an LIMA graft to the LAD, saphenous vein graft to the diagonal,  saphenous vein graft to the first and second obtuse marginal vessels, saphenous vein graft to the distal right coronary   Dyslipidemia    FHx: coronary artery disease    Hypertension    Parkinson disease    Renal calculi    Past Surgical History:  Procedure Laterality Date   CORONARY ARTERY BYPASS GRAFT  11/2008   HERNIA REPAIR     Status post x3   KNEE SURGERY     Status post arthroscopic     Current Meds  Medication Sig   Acetaminophen (TYLENOL ARTHRITIS PAIN PO) Take 1 tablet by mouth daily as needed. For pain   carbidopa-levodopa (SINEMET) 25-100 MG per tablet Take 1-2 tablets by mouth See admin instructions. Take 1 tablet in the morning, 1 tablets at lunch, 1 at 4pm and 1 tablet at night.   Cholecalciferol (VITAMIN D3) 25 MCG (1000 UT) CAPS Take  1 capsule by mouth daily in the afternoon.   docusate sodium (COLACE) 250 MG capsule Take 250 mg by mouth daily.   finasteride (PROSCAR) 5 MG tablet Take 1 tablet (5 mg total) by mouth daily.   hydrochlorothiazide (HYDRODIURIL) 25 MG tablet TAKE 1/2 TABLET EVERY DAY   mirtazapine (REMERON) 15 MG tablet Take 1 tablet (15 mg total) by mouth at bedtime.   sertraline (ZOLOFT) 25 MG tablet Take 25 mg by mouth daily.     Allergies:   Patient has no known allergies.   Social History   Tobacco Use   Smoking status:  Former   Smokeless tobacco: Never  Building services engineer Use: Never used  Substance Use Topics   Alcohol use: No   Drug use: No     Family Hx: The patient's family history includes Diabetes in his father; Glaucoma in his brother and sister; Hypertension in his father; Stroke in his mother.  ROS:   Please see the history of present illness.     All other systems reviewed and are negative.   Prior CV studies:   The following studies were reviewed today:  Echo 08/30/21: Impression  Left Ventricle: Systolic function is normal. EF: 60-65%. The  quantitative EF by 3D imaging is 65 %.    Tricuspid Valve: There is mild regurgitation.    Tricuspid Valve: The right ventricular systolic pressure is normal (<36  mmHg).  Labs/Other Tests and Data Reviewed:    EKG:  No ECG reviewed.  Recent Labs: No results found for requested labs within last 365 days.   Recent Lipid Panel Lab Results  Component Value Date/Time   CHOL 116 05/10/2021 11:12 AM   TRIG 59 05/10/2021 11:12 AM   HDL 51 05/10/2021 11:12 AM   CHOLHDL 2.3 05/10/2021 11:12 AM   CHOLHDL 2.3 09/24/2016 01:33 PM   LDLCALC 52 05/10/2021 11:12 AM    Wt Readings from Last 3 Encounters:  12/12/20 177 lb (80.3 kg)  05/28/20 177 lb 3.2 oz (80.4 kg)  11/18/19 187 lb 3.2 oz (84.9 kg)     Risk Assessment/Calculations:          Objective:    Vital Signs:  BP 114/88   Pulse 90   Ht 6' (1.829 m)   BMI 24.01 kg/m      ASSESSMENT & PLAN:    1.  CAD - prior CABG in 2010. Normal Myoview study October 2013. He remains  asymptomatic. Activity is very limited.  ASA, beta blocker and Crestor now discontinued due to hematuria and pause. Only on low dose HCT from a cardiac standpoint. Prognosis is poor given advanced age and the fact that he is bed bound. Agree with Palliative care consult.    2. HTN - BP ok on low dose HCTZ   3. Parkinson's disease- per Neurology   4. Hyperlipidemia   5. Severe osteoarthritis of the  knees. Continue conservative therapy.   6. Urinary retention. Has foley in place. Follow up with urology.           COVID-19 Education: The signs and symptoms of COVID-19 were discussed with the patient and how to seek care for testing (follow up with PCP or arrange E-visit).  The importance of social distancing was discussed today.  Time:   Today, I have spent 10 minutes with the patient with telehealth technology discussing the above problems.     Medication Adjustments/Labs and Tests Ordered: Current medicines are reviewed at length with the patient  today.  Concerns regarding medicines are outlined above.   Tests Ordered: No orders of the defined types were placed in this encounter.   Medication Changes: No orders of the defined types were placed in this encounter.   Follow Up:  PRN  Signed, Woodard Perrell [place], MD  03/03/2023 8:51 AM    [facility]

## 2023-03-03 ENCOUNTER — Encounter: Payer: Self-pay | Admitting: Cardiology

## 2023-03-03 ENCOUNTER — Ambulatory Visit: Payer: Medicare Other | Attending: Cardiology | Admitting: Cardiology

## 2023-03-03 VITALS — BP 114/88 | HR 90 | Ht 72.0 in

## 2023-03-03 DIAGNOSIS — I25810 Atherosclerosis of coronary artery bypass graft(s) without angina pectoris: Secondary | ICD-10-CM

## 2023-03-03 DIAGNOSIS — I10 Essential (primary) hypertension: Secondary | ICD-10-CM

## 2023-12-12 DEATH — deceased
